# Patient Record
Sex: Female | Born: 1940 | Race: Black or African American | Hispanic: No | Smoking: Never smoker
Health system: Southern US, Community
[De-identification: ages and names within clinical notes are randomized; demographics above are authoritative.]

## PROBLEM LIST (undated history)

## (undated) DIAGNOSIS — Z8042 Family history of malignant neoplasm of prostate: Secondary | ICD-10-CM

## (undated) DIAGNOSIS — Z803 Family history of malignant neoplasm of breast: Secondary | ICD-10-CM

## (undated) DIAGNOSIS — Z9221 Personal history of antineoplastic chemotherapy: Secondary | ICD-10-CM

## (undated) DIAGNOSIS — K219 Gastro-esophageal reflux disease without esophagitis: Secondary | ICD-10-CM

## (undated) DIAGNOSIS — I10 Essential (primary) hypertension: Secondary | ICD-10-CM

## (undated) DIAGNOSIS — Z923 Personal history of irradiation: Secondary | ICD-10-CM

## (undated) DIAGNOSIS — Z800 Family history of malignant neoplasm of digestive organs: Secondary | ICD-10-CM

## (undated) HISTORY — DX: Family history of malignant neoplasm of breast: Z80.3

## (undated) HISTORY — PX: MASTECTOMY: SHX3

## (undated) HISTORY — DX: Family history of malignant neoplasm of digestive organs: Z80.0

## (undated) HISTORY — DX: Essential (primary) hypertension: I10

## (undated) HISTORY — DX: Family history of malignant neoplasm of prostate: Z80.42

---

## 1998-06-06 ENCOUNTER — Ambulatory Visit (HOSPITAL_COMMUNITY): Admission: RE | Admit: 1998-06-06 | Discharge: 1998-06-06 | Payer: Self-pay | Admitting: Pulmonary Disease

## 1998-06-06 ENCOUNTER — Encounter: Payer: Self-pay | Admitting: Pulmonary Disease

## 1998-06-12 ENCOUNTER — Ambulatory Visit (HOSPITAL_COMMUNITY): Admission: RE | Admit: 1998-06-12 | Discharge: 1998-06-12 | Payer: Self-pay | Admitting: Pulmonary Disease

## 1998-06-12 ENCOUNTER — Encounter: Payer: Self-pay | Admitting: Pulmonary Disease

## 1998-10-23 ENCOUNTER — Encounter: Admission: RE | Admit: 1998-10-23 | Discharge: 1999-01-21 | Payer: Self-pay | Admitting: Orthopedic Surgery

## 1998-10-23 ENCOUNTER — Ambulatory Visit (HOSPITAL_COMMUNITY): Admission: RE | Admit: 1998-10-23 | Discharge: 1998-10-23 | Payer: Self-pay | Admitting: Orthopedic Surgery

## 2000-04-13 ENCOUNTER — Ambulatory Visit (HOSPITAL_COMMUNITY): Admission: RE | Admit: 2000-04-13 | Discharge: 2000-04-13 | Payer: Self-pay | Admitting: Internal Medicine

## 2000-04-13 ENCOUNTER — Encounter: Payer: Self-pay | Admitting: Internal Medicine

## 2005-06-30 HISTORY — PX: OTHER SURGICAL HISTORY: SHX169

## 2009-09-19 ENCOUNTER — Encounter: Admission: RE | Admit: 2009-09-19 | Discharge: 2009-11-15 | Payer: Self-pay

## 2010-10-01 ENCOUNTER — Ambulatory Visit: Payer: Self-pay | Admitting: Physical Therapy

## 2010-10-01 DIAGNOSIS — Z139 Encounter for screening, unspecified: Secondary | ICD-10-CM | POA: Insufficient documentation

## 2012-06-30 HISTORY — PX: MASTECTOMY: SHX3

## 2017-01-22 ENCOUNTER — Other Ambulatory Visit: Payer: Self-pay | Admitting: Family Medicine

## 2017-01-22 DIAGNOSIS — R5381 Other malaise: Secondary | ICD-10-CM

## 2017-01-23 ENCOUNTER — Other Ambulatory Visit: Payer: Self-pay | Admitting: Family Medicine

## 2017-01-23 DIAGNOSIS — E2839 Other primary ovarian failure: Secondary | ICD-10-CM

## 2017-04-15 ENCOUNTER — Other Ambulatory Visit: Payer: Self-pay | Admitting: Family Medicine

## 2017-04-15 DIAGNOSIS — Z853 Personal history of malignant neoplasm of breast: Secondary | ICD-10-CM

## 2017-04-15 DIAGNOSIS — Z1231 Encounter for screening mammogram for malignant neoplasm of breast: Secondary | ICD-10-CM

## 2017-05-06 ENCOUNTER — Ambulatory Visit
Admission: RE | Admit: 2017-05-06 | Discharge: 2017-05-06 | Disposition: A | Discharge status: Home / Self Care | Payer: Medicare Other | Source: Ambulatory Visit | Attending: Family Medicine | Admitting: Family Medicine

## 2017-05-06 ENCOUNTER — Inpatient Hospital Stay
Admission: RE | Admit: 2017-05-06 | Discharge: 2017-05-06 | Disposition: A | Discharge status: Home / Self Care | Payer: Self-pay | Source: Ambulatory Visit | Attending: Family Medicine | Admitting: Family Medicine

## 2017-05-06 ENCOUNTER — Other Ambulatory Visit: Payer: Self-pay | Admitting: Family Medicine

## 2017-05-06 DIAGNOSIS — Z853 Personal history of malignant neoplasm of breast: Secondary | ICD-10-CM

## 2017-05-06 DIAGNOSIS — Z1231 Encounter for screening mammogram for malignant neoplasm of breast: Secondary | ICD-10-CM

## 2017-05-06 DIAGNOSIS — E2839 Other primary ovarian failure: Secondary | ICD-10-CM

## 2017-12-17 ENCOUNTER — Ambulatory Visit
Admission: RE | Admit: 2017-12-17 | Discharge: 2017-12-17 | Disposition: A | Discharge status: Home / Self Care | Payer: Medicare Other | Source: Ambulatory Visit | Attending: Physician Assistant | Admitting: Physician Assistant

## 2017-12-17 ENCOUNTER — Other Ambulatory Visit: Payer: Self-pay | Admitting: Physician Assistant

## 2017-12-17 DIAGNOSIS — M25519 Pain in unspecified shoulder: Secondary | ICD-10-CM

## 2018-04-19 ENCOUNTER — Other Ambulatory Visit: Payer: Self-pay | Admitting: Family Medicine

## 2018-04-19 DIAGNOSIS — Z1231 Encounter for screening mammogram for malignant neoplasm of breast: Secondary | ICD-10-CM

## 2018-05-25 ENCOUNTER — Ambulatory Visit
Admission: RE | Admit: 2018-05-25 | Discharge: 2018-05-25 | Disposition: A | Discharge status: Home / Self Care | Payer: Medicare Other | Source: Ambulatory Visit | Attending: Family Medicine | Admitting: Family Medicine

## 2018-05-25 DIAGNOSIS — Z1231 Encounter for screening mammogram for malignant neoplasm of breast: Secondary | ICD-10-CM

## 2018-05-25 HISTORY — DX: Personal history of irradiation: Z92.3

## 2018-05-25 HISTORY — DX: Personal history of antineoplastic chemotherapy: Z92.21

## 2018-05-31 ENCOUNTER — Other Ambulatory Visit: Payer: Self-pay | Admitting: Family Medicine

## 2018-05-31 DIAGNOSIS — R928 Other abnormal and inconclusive findings on diagnostic imaging of breast: Secondary | ICD-10-CM

## 2018-06-02 ENCOUNTER — Other Ambulatory Visit: Payer: Medicare Other

## 2018-06-04 ENCOUNTER — Ambulatory Visit
Admission: RE | Admit: 2018-06-04 | Discharge: 2018-06-04 | Disposition: A | Discharge status: Home / Self Care | Payer: Medicare Other | Source: Ambulatory Visit | Attending: Family Medicine | Admitting: Family Medicine

## 2018-06-04 DIAGNOSIS — R928 Other abnormal and inconclusive findings on diagnostic imaging of breast: Secondary | ICD-10-CM

## 2019-05-05 ENCOUNTER — Other Ambulatory Visit: Payer: Self-pay | Admitting: Family Medicine

## 2019-05-05 DIAGNOSIS — Z1231 Encounter for screening mammogram for malignant neoplasm of breast: Secondary | ICD-10-CM

## 2019-06-27 ENCOUNTER — Ambulatory Visit
Admission: RE | Admit: 2019-06-27 | Discharge: 2019-06-27 | Disposition: A | Discharge status: Home / Self Care | Payer: Medicare Other | Source: Ambulatory Visit | Attending: Family Medicine | Admitting: Family Medicine

## 2019-06-27 ENCOUNTER — Other Ambulatory Visit: Payer: Self-pay

## 2019-06-27 DIAGNOSIS — Z1231 Encounter for screening mammogram for malignant neoplasm of breast: Secondary | ICD-10-CM

## 2019-06-28 ENCOUNTER — Other Ambulatory Visit: Payer: Self-pay | Admitting: Family Medicine

## 2019-06-28 DIAGNOSIS — R928 Other abnormal and inconclusive findings on diagnostic imaging of breast: Secondary | ICD-10-CM

## 2019-07-04 ENCOUNTER — Other Ambulatory Visit: Payer: Self-pay | Admitting: Family Medicine

## 2019-07-04 ENCOUNTER — Other Ambulatory Visit: Payer: Self-pay

## 2019-07-04 ENCOUNTER — Ambulatory Visit
Admission: RE | Admit: 2019-07-04 | Discharge: 2019-07-04 | Disposition: A | Discharge status: Home / Self Care | Payer: Medicare Other | Source: Ambulatory Visit | Attending: Family Medicine | Admitting: Family Medicine

## 2019-07-04 DIAGNOSIS — R928 Other abnormal and inconclusive findings on diagnostic imaging of breast: Secondary | ICD-10-CM

## 2019-07-04 DIAGNOSIS — R921 Mammographic calcification found on diagnostic imaging of breast: Secondary | ICD-10-CM

## 2019-07-13 ENCOUNTER — Ambulatory Visit
Admission: RE | Admit: 2019-07-13 | Discharge: 2019-07-13 | Disposition: A | Discharge status: Home / Self Care | Payer: Medicare Other | Source: Ambulatory Visit | Attending: Family Medicine | Admitting: Family Medicine

## 2019-07-13 ENCOUNTER — Other Ambulatory Visit: Payer: Self-pay

## 2019-07-13 ENCOUNTER — Other Ambulatory Visit: Payer: Self-pay | Admitting: Family Medicine

## 2019-07-13 DIAGNOSIS — R921 Mammographic calcification found on diagnostic imaging of breast: Secondary | ICD-10-CM

## 2019-07-14 ENCOUNTER — Encounter: Payer: Self-pay | Admitting: *Deleted

## 2019-07-15 ENCOUNTER — Telehealth: Payer: Self-pay | Admitting: Hematology

## 2019-07-15 NOTE — Telephone Encounter (Signed)
Spoke to patient to confirm afternoon [facility] appointment for 1/20, packet mailed and emailed to patient

## 2019-07-18 ENCOUNTER — Other Ambulatory Visit: Payer: Self-pay | Admitting: *Deleted

## 2019-07-18 ENCOUNTER — Encounter: Payer: Self-pay | Admitting: *Deleted

## 2019-07-18 DIAGNOSIS — D0512 Intraductal carcinoma in situ of left breast: Secondary | ICD-10-CM

## 2019-07-18 NOTE — Progress Notes (Signed)
[place]   [phone] Fax:([phone]) [place] Note   Patient Care Team: Katherina Mires, MD as PCP - General (Family Medicine) Rockwell Germany, RN as Oncology Nurse Navigator Mauro Kaufmann, RN as Oncology Nurse Navigator  Date of Service:  07/20/2019   CHIEF COMPLAINTS/PURPOSE OF CONSULTATION:  Newly Diagnosed Ductal carcinoma in situ (DCIS) of left breast   Oncology History Overview Note  Cancer Staging No matching staging information was found for the patient.    Ductal carcinoma in situ (DCIS) of left breast  07/04/2019 Mammogram    Diagnostic Mammogram 07/04/19 IMPRESSION: Indeterminate calcifications in a linear pattern and at least 1 branch point in the upper outer left breast spanning 5 cm.     07/13/2019 Initial Biopsy    Diagnosis 07/13/19 1. Breast, left, needle core biopsy, anterior, UOQ coil clip - DUCTAL CARCINOMA IN SITU WITH CALCIFICATIONS, SEE COMMENT. - USUAL DUCTAL HYPERPLASIA. 2. Breast, left, needle core biopsy, posterior, UOQ X clip - ATYPICAL DUCTAL HYPERPLASIA WITH CALCIFICATIONS.   07/13/2019 Receptors her2    1. PROGNOSTIC INDICATORS Results: IMMUNOHISTOCHEMICAL AND MORPHOMETRIC ANALYSIS PERFORMED MANUALLY Estrogen Receptor: 95%, POSITIVE, STRONG STAINING INTENSITY Progesterone Receptor: 90%, POSITIVE, STRONG STAINING INTENSITY   07/18/2019 Initial Diagnosis   Ductal carcinoma in situ (DCIS) of left breast      HISTORY OF PRESENTING ILLNESS:  Patricia Matthews 79 y.o. female is a here because of newly diagnosed left breast DCIS. The patient presents to the Breast clinic today accompanied by her daughters.   She was diagnosed with right breast  cancer in 2003 discussed by mammogram. She underwent mastectomy, no reconstruction. She received adjuvant chemotherapy over 3 months with adriamycin and RT. She did not take antiestrogen therapy. She last saw her oncologist 2 years ago at [facility].  Her current DCIS  was found by mammogram. She denies nay change in her breast, nipple or body.   Today she has diffuse arthritis all over. She does not use cane/walker. She is on Tramadol 3-4 times a day managed by pain clinic in [place]. She required right knee replacement by orthopedist at [facility]. She denies constipation from Tramadol. She does not try to walk far. She notes she is otherwise baseline with no major system issues of chest, breathing, abdomen. She is active at home, but not with exercise.   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 She has 2 daughters  She has not drink alcohol in over 30 years. She is a non smoker. She lives alone and is still independent.  She has a PMHx of HTN. She has had mastectomy in 2003, right knee replacement surgery in 2007. I reviewed her medication list with her. Her mother had breast cancer in her 45s, her sister also had breast cancer. 2 of her brothers had prostate cancer and 5 paternal cousins with pancreatic cancer.     GYN HISTORY  Menarchal: 52 LMP: 79 years old  Contraceptive: No HRT: No G3P2A1 - has 2 daughters, first at 30, no breast feeding     REVIEW OF SYSTEMS:    Constitutional: Denies fevers, chills or abnormal night sweats Eyes: Denies blurriness of vision, double vision or watery eyes Ears, nose, mouth, throat, and face: Denies mucositis or sore throat Respiratory: Denies cough, dyspnea or wheezes Cardiovascular: Denies palpitation, chest discomfort or lower extremity swelling Gastrointestinal:  Denies nausea, heartburn or change in bowel habits Skin: Denies abnormal skin rashes MSK: (+) Diffuse arthritis  Lymphatics: Denies new  lymphadenopathy or easy bruising Neurological:Denies numbness, tingling or new weaknesses Behavioral/Psych: Mood is stable, no new changes  All other systems were reviewed with the patient and are negative.   MEDICAL HISTORY:  Past Medical History:  Diagnosis Date  . Hypertension   . Personal history of  chemotherapy   . Personal history of radiation therapy     SURGICAL HISTORY: Past Surgical History:  Procedure Laterality Date  . MASTECTOMY Right 2014  . right knee replacement  2007    SOCIAL HISTORY: Social History   Socioeconomic History  . Marital status: Single    Spouse name: Not on file  . Number of children: 2  . Years of education: Not on file  . Highest education level: Not on file  Occupational History  . Occupation: retired   Tobacco Use  . Smoking status: Never Smoker  . Smokeless tobacco: Never Used  Substance and Sexual Activity  . Alcohol use: Never  . Drug use: Not on file  . Sexual activity: Not on file  Other Topics Concern  . Not on file  Social History Narrative  . Not on file   Social Determinants of Health   Financial Resource Strain:   . Difficulty of Paying Living Expenses: Not on file  Food Insecurity:   . Worried About Charity fundraiser in the Last Year: Not on file  . Ran Out of Food in the Last Year: Not on file  Transportation Needs:   . Lack of Transportation (Medical): Not on file  . Lack of Transportation (Non-Medical): Not on file  Physical Activity:   . Days of Exercise per Week: Not on file  . Minutes of Exercise per Session: Not on file  Stress:   . Feeling of Stress : Not on file  Social Connections:   . Frequency of Communication with Friends and Family: Not on file  . Frequency of Social Gatherings with Friends and Family: Not on file  . Attends Religious Services: Not on file  . Active Member of Clubs or Organizations: Not on file  . Attends Archivist Meetings: Not on file  . Marital Status: Not on file  Intimate Partner Violence:   . Fear of Current or Ex-Partner: Not on file  . Emotionally Abused: Not on file  . Physically Abused: Not on file  . Sexually Abused: Not on file    FAMILY HISTORY: Family History  Problem Relation Age of Onset  . Breast cancer Mother        28s  . Breast cancer  Sister        42s  . Cancer Brother        prostate cancer   . Cancer Brother        prostate cancer  . Cancer Cousin        pancreatic cancer  . Cancer Cousin        pancreatic cancer in 5 paternal cousin     ALLERGIES:  has no allergies on file.  MEDICATIONS:  Current Outpatient Medications  Medication Sig Dispense Refill  . calcium-vitamin D (OSCAL WITH D) 500-200 MG-UNIT TABS tablet Take 2 tablets by mouth 2 (two) times daily.    . cyclobenzaprine (FLEXERIL) 10 MG tablet Take 1 tablet by mouth daily as needed.    . DULoxetine (CYMBALTA) 30 MG capsule Take 1 capsule by mouth daily.    . ergocalciferol (VITAMIN D2) 1.25 MG (50000 UT) capsule Take 1 capsule by mouth daily.    Marland Kitchen  gabapentin (NEURONTIN) 300 MG capsule Take 1 capsule by mouth at bedtime.    Marland Kitchen lisinopril-hydrochlorothiazide (ZESTORETIC) 10-12.5 MG tablet Take 1 tablet by mouth daily.    . meclizine (ANTIVERT) 25 MG tablet Take 1 tablet by mouth 3 (three) times daily.    Marland Kitchen omeprazole (PRILOSEC) 20 MG capsule Take 2 capsules by mouth daily.    . pravastatin (PRAVACHOL) 40 MG tablet Take 1 tablet by mouth daily.    . SUMAtriptan (IMITREX) 25 MG tablet Take 1 tablet by mouth every 2 (two) hours as needed.    . traMADol (ULTRAM) 50 MG tablet Take 1 tablet by mouth 2 (two) times daily as needed.     No current facility-administered medications for this visit.    PHYSICAL EXAMINATION: ECOG PERFORMANCE STATUS: 1 - Symptomatic but completely ambulatory  Vitals:   07/20/19 1240  BP: (!) 156/88  Pulse: 80  Resp: 18  Temp: 98.5 F (36.9 C)  SpO2: 98%   Filed Weights   07/20/19 1240  Weight: 215 lb 11.2 oz (97.8 kg)    GENERAL:alert, no distress and comfortable SKIN: skin color, texture, turgor are normal, no rashes or significant lesions EYES: normal, Conjunctiva are pink and non-injected, sclera clear  NECK: supple, thyroid normal size, non-tender, without nodularity LYMPH:  no palpable lymphadenopathy in the  cervical, axillary  LUNGS: clear to auscultation and percussion with normal breathing effort HEART: regular rate & rhythm and no murmurs and no lower extremity edema ABDOMEN:abdomen soft, non-tender and normal bowel sounds Musculoskeletal:no cyanosis of digits and no clubbing  NEURO: alert & oriented x 3 with fluent speech, no focal motor/sensory deficits BREAST: S/p right mastectomy: Surgical incision healed well (+)hyperpigmentation of b/l skin folds of breast. No palpable mass, nodules or adenopathy bilaterally. Breast exam benign.  LABORATORY DATA:  I have reviewed the data as listed CBC Latest Ref Rng & Units 07/20/2019  WBC 4.0 - 10.5 K/uL 5.9  Hemoglobin 12.0 - 15.0 g/dL 12.4  Hematocrit 36.0 - 46.0 % 37.8  Platelets 150 - 400 K/uL 250    CMP Latest Ref Rng & Units 07/20/2019  Glucose 70 - 99 mg/dL 96  BUN 8 - 23 mg/dL 8  Creatinine 0.44 - 1.00 mg/dL 0.75  Sodium 135 - 145 mmol/L 138  Potassium 3.5 - 5.1 mmol/L 4.2  Chloride 98 - 111 mmol/L 101  CO2 22 - 32 mmol/L 29  Calcium 8.9 - 10.3 mg/dL 9.5  Total Protein 6.5 - 8.1 g/dL 7.8  Total Bilirubin 0.3 - 1.2 mg/dL 0.4  Alkaline Phos 38 - 126 U/L 64  AST 15 - 41 U/L 15  ALT 0 - 44 U/L 8     RADIOGRAPHIC STUDIES: I have personally reviewed the radiological images as listed and agreed with the findings in the report. MM Digital Diagnostic Unilat L  Result Date: 07/04/2019 CLINICAL DATA:  The patient was called back for left breast calcifications EXAM: DIGITAL DIAGNOSTIC LEFT MAMMOGRAM COMPARISON:  Previous exam(s). ACR Breast Density Category b: There are scattered areas of fibroglandular density. FINDINGS: There are calcifications in a linear pattern spanning 5 cm in the upper outer left breast with at least 1 branch point. IMPRESSION: Indeterminate calcifications in a linear pattern and at least 1 branch point in the upper outer left breast spanning 5 cm. RECOMMENDATION: Recommend stereotactic biopsy of the left breast  calcifications. Recommend biopsying the anterior extent of calcifications and towards the posterior extent. I have discussed the findings and recommendations with the patient. If applicable,  a reminder letter will be sent to the patient regarding the next appointment. BI-RADS CATEGORY  4: Suspicious. Electronically Signed   By: Dorise Bullion III M.D   On: 07/04/2019 16:09   MM 3D SCREEN BREAST UNI LEFT  Result Date: 06/27/2019 CLINICAL DATA:  Screening. EXAM: DIGITAL SCREENING UNILATERAL LEFT MAMMOGRAM WITH CAD AND TOMO COMPARISON:  Previous exam(s). ACR Breast Density Category b: There are scattered areas of fibroglandular density. FINDINGS: In the left breast, calcifications warrant further evaluation. In the right breast, no findings suspicious for malignancy. Images were processed with CAD. IMPRESSION: Further evaluation is suggested for calcifications in the left breast. RECOMMENDATION: Diagnostic mammogram of the left breast. (Code:[id number]) The patient will be contacted regarding the findings, and additional imaging will be scheduled. BI-RADS CATEGORY  0: Incomplete. Need additional imaging evaluation and/or prior mammograms for comparison. Electronically Signed   By: Claudie Revering M.D.   On: 06/27/2019 16:45   MM CLIP PLACEMENT LEFT  Result Date: 07/13/2019 CLINICAL DATA:  Status post stereotactic guided core biopsy of 2 sites of calcifications in the UPPER-OUTER QUADRANT LEFT breast. EXAM: DIAGNOSTIC LEFT MAMMOGRAM POST STEREOTACTIC BIOPSY x2 COMPARISON:  Previous exam(s). FINDINGS: Mammographic images were obtained following stereotactic guided biopsy of anterior aspect of calcifications in the UPPER-OUTER QUADRANT of the LEFT breast and placement of a coil shaped clip. The biopsy marking clip is in expected position at the site of biopsy. Following biopsy the posterior extent of calcifications, an X shaped clip was placed in is also identified at the expected location. Clips are 4.4 centimeters  apart. IMPRESSION: Tissue marker clips are in the expected locations after biopsy. Final Assessment: Post Procedure Mammograms for Marker Placement Electronically Signed   By: Nolon Nations M.D.   On: 07/13/2019 11:31   MM LT BREAST BX W LOC DEV 1ST LESION IMAGE BX SPEC STEREO GUIDE  Addendum Date: 07/14/2019   ADDENDUM REPORT: 07/14/2019 15:33 ADDENDUM: Pathology revealed LOW GRADE DUCTAL CARCINOMA IN SITU WITH CALCIFICATIONS, USUAL DUCTAL HYPERPLASIA of the LEFT breast, anterior, upper outer quadrant, coil clip. This was found to be concordant by Dr. Nolon Nations. Pathology revealed ATYPICAL DUCTAL HYPERPLASIA WITH CALCIFICATIONS of the LEFT breast, posterior, upper outer quadrant, X clip. This was found to be concordant by Dr. Nolon Nations, with excision recommended. Pathology results were discussed with the patient by telephone. The patient reported doing well after the biopsies with tenderness at the sites. Post biopsy instructions and care were reviewed and questions were answered. The patient was encouraged to call [facility] for any additional concerns. The patient was referred to [facility] on July 20, 2019. Pathology results reported by Stacie Acres RN on 07/14/2019. Electronically Signed   By: Nolon Nations M.D.   On: 07/14/2019 15:33   Result Date: 07/14/2019 CLINICAL DATA:  Patient presents for stereotactic guided core biopsy of 2 sites in the LEFT breast. EXAM: LEFT BREAST STEREOTACTIC CORE NEEDLE BIOPSY x2 COMPARISON:  Previous exams. FINDINGS: The patient and I discussed the procedure of stereotactic-guided biopsy including benefits and alternatives. We discussed the high likelihood of a successful procedure. We discussed the risks of the procedure including infection, bleeding, tissue injury, clip migration, and inadequate sampling. Informed written consent was given. The  usual time out protocol was performed immediately prior to the procedure. Site 1: Anterior UPPER OUTER QUADRANT LEFT breast. Lesion quadrant: UPPER-OUTER QUADRANT LEFT breast Using sterile technique and 1% lidocaine and  2% lidocaine with epinephrine as local anesthetic, under stereotactic guidance, a 9 gauge vacuum assisted device was used to perform core needle biopsy of calcifications in the anterior UPPER OUTER QUADRANT of the breast using a 2 craniocaudal approach. Specimen radiograph was performed showing calcifications in numerous samples. Specimens with calcifications are identified for pathology. At the conclusion of the procedure, coil shaped tissue marker clip was deployed into the biopsy cavity. Site 2: Posterior UPPER OUTER QUADRANT LEFT breast. Lesion quadrant: UPPER-OUTER QUADRANT breast Using sterile technique and 1% lidocaine and 2% lidocaine with epinephrine as local anesthetic, under stereotactic guidance, a 9 gauge vacuum assisted device was used to perform core needle biopsy of calcifications in the posterior UPPER OUTER QUADRANT LEFT breast using a craniocaudal approach. Specimen radiograph was performed showing calcifications in numerous samples. Specimens with calcifications are identified for pathology. At the conclusion of the procedure, X shaped tissue marker clip was deployed into the biopsy cavity. Follow-up 2-view mammogram was performed and dictated separately. IMPRESSION: Stereotactic-guided biopsy of 2 sites of calcifications in the LEFT breast. No apparent complications. Electronically Signed: By: Nolon Nations M.D. 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 This was found to be concordant by Dr.  Nolon Nations. Pathology revealed ATYPICAL DUCTAL HYPERPLASIA WITH CALCIFICATIONS of the LEFT breast, posterior, upper outer quadrant, X clip. This was found to be concordant by Dr. Nolon Nations, with excision recommended. Pathology results were discussed with the patient by telephone. The patient reported doing well after the biopsies with tenderness at the sites. Post biopsy instructions and care were reviewed and questions were answered. The patient was encouraged to call [facility] for any additional concerns. The patient was referred to [facility] on July 20, 2019. Pathology results reported by Stacie Acres RN on 07/14/2019. Electronically Signed   By: Nolon Nations M.D.   On: 07/14/2019 15:33   Result Date: 07/14/2019 CLINICAL DATA:  Patient presents for stereotactic guided core biopsy of 2 sites in the LEFT breast. EXAM: LEFT BREAST STEREOTACTIC CORE NEEDLE BIOPSY x2 COMPARISON:  Previous exams. FINDINGS: The patient and I discussed the procedure of stereotactic-guided biopsy including benefits and alternatives. We discussed the high likelihood of a successful procedure. We discussed the risks of the procedure including infection, bleeding, tissue injury, clip migration, and inadequate sampling. Informed written consent was given. The usual time out protocol was performed immediately prior to the procedure. Site 1: Anterior UPPER OUTER QUADRANT LEFT breast. Lesion quadrant: UPPER-OUTER QUADRANT LEFT breast Using sterile technique and 1% lidocaine and 2% lidocaine with epinephrine as local anesthetic, under stereotactic guidance, a 9 gauge vacuum assisted device was used to perform core needle biopsy of calcifications in the anterior UPPER OUTER QUADRANT of the breast using a 2 craniocaudal approach. Specimen radiograph was performed showing calcifications in numerous samples. Specimens with  calcifications are identified for pathology. At the conclusion of the procedure, coil shaped tissue marker clip was deployed into the biopsy cavity. Site 2: Posterior UPPER OUTER QUADRANT LEFT breast. Lesion quadrant: UPPER-OUTER QUADRANT breast Using sterile technique and 1% lidocaine and 2% lidocaine with epinephrine as local anesthetic, under stereotactic guidance, a 9 gauge vacuum  assisted device was used to perform core needle biopsy of calcifications in the posterior UPPER OUTER QUADRANT LEFT breast using a craniocaudal approach. Specimen radiograph was performed showing calcifications in numerous samples. Specimens with calcifications are identified for pathology. At the conclusion of the procedure, X shaped tissue marker clip was deployed into the biopsy cavity. Follow-up 2-view mammogram was performed and dictated separately. IMPRESSION: Stereotactic-guided biopsy of 2 sites of calcifications in the LEFT breast. No apparent complications. Electronically Signed: By: Nolon Nations M.D. On: 07/13/2019 11:24    ASSESSMENT & PLAN:  Patricia Matthews is a 79 y.o. African American female with a history of HTN and arthritis.    1 Ductal carcinoma in situ (DCIS) of left breast, Stage 0, ER/PR+, Low Grade  -I discussed her breast imaging and needle biopsy results with patient and her family members in great detail. She was find to have DCIS of left breast, with ADH. No indication of invasive disease.  -She is a candidate for breast conservation surgery.  She previously had right mastectomy without reconstruction, , so she rather to have left mastectomy to avoid adjuvant therapy.  She has been seen by breast surgeon Dr. Marlou Starks today and will likely do mastectomy.  -With h/o of right breast cancer, DCIS and ADH she has higher risk of future breast cancer, about 15-20% in next 10 years post lumpectomy. If she undergo lumpectomy, she would benefit from adjuvant radiation and oral estrogen therapy.  Due to her severe  arthralgia, she is not interested in antiestrogen therapy. -Her DCIS will be cured by complete surgical resection.  No any adjuvant therapy is needed if she undergo mastectomy. -We also discussed that biopsy may have sampling limitation, we will review her surgical path, to see if she has any invasive carcinoma components. -We discussed breast cancer surveillance after she completes treatment, Including annual mammogram, breast exam every 6-12 months. -Labs reviewed today, CBC and CMP WNL. Physical exam today unremarkable. I encouraged her to use corn starch in breast skin folds to prevent infection.  -F/u open as she may not need close follow up.    2. H/o of Right breast cancer, diagnosed in 2003, treated at [facility] -S/p right mastectomy, no reconstruction. She was treated with adjuvant chemo including Adriamycin and adjuvant RT. No antiestrogen therapy. She was last seen by oncologist 2 year ago.  -She had genetics at [facility] with prior breast cancer. Given h/o of breast cancer for her and her family along with prostate and pancreatic cancer she is eligible for updated genetic testing. She is interested.    3. HTN, Migraines, Diffuse Arthritis  -She is on Lisinopril-HCTZ  -For her occasional Migraines she takes Mitrex  -She is s/p right knee replacement in 2007. She still has diffuse significant arthritis. She is on Tramadol 3-4 times a day for pain, managed by her pain specialist. She also takes Flexeril, Cymbalta, Vit D.     PLAN:  -Genetic Testing  -Proceed with Surgery  -F/u open, no need f/u with me if she has mastectomy   No orders of the defined types were placed in this encounter.   All questions were answered. The patient knows to call the clinic with any problems, questions or concerns. The total time spent in the appointment was 45 minutes.     Truitt Merle, MD 07/20/2019 4:08 PM  I, Joslyn Devon, am acting as scribe for Truitt Merle, MD.   I have reviewed the above documentation  for accuracy and completeness, and I  agree with the above.

## 2019-07-19 NOTE — Progress Notes (Signed)
Radiation Oncology         [phone] ________________________________  Initial outpatient Consultation in person  Name: Patricia Matthews MRN: [id number]  Date: 07/20/2019  DOB: [birth year]  [id number], Jannifer Rodney, MD  Jovita Kussmaul, MD   REFERRING PHYSICIAN: Jovita Kussmaul, MD  DIAGNOSIS:    ICD-10-CM   1. Ductal carcinoma in situ (DCIS) of left breast  D05.12   Cancer Staging Ductal carcinoma in situ (DCIS) of left breast Staging form: Breast, AJCC 8th Edition - Clinical stage from 07/20/2019: Stage 0 (cTis (DCIS), cN0, cM0, ER+, PR+) - Unsigned   Stage 0 Left Breast UOQ DCIS, ER+ / PR+, Grade 1  CHIEF COMPLAINT: Here to discuss management of left breast DCIS  HISTORY OF PRESENT ILLNESS::Patricia Matthews is a 79 y.o. female with a history of prior right breast cancer. She is s/p right mastectomy in 2003 for stage T3, N1, M0 overexpressing breast cancer, ER/PR-, Her2+. She received 4 cycles of AC and taxol, as well as a course of postmastectomy radiation therapy.  Most recently, she presented with breast abnormality on the following imaging: screening mammogram on the date of 06/27/2019.  Symptoms, if any, at that time, were: none.   Diagnostic mammogram on 07/04/2019 revealed calcifications spanning 5 cm in the upper-outer left breast with at least 1 branch point.   Biopsy on date of 07/13/2019 showed DCIS with calcifications.  ER status: positive; PR status positive; Grade 1.  She has a significant family history and a genetics consultation is pending.  She plans on pursuing mastectomy with Dr. Marlou Starks.  PREVIOUS RADIATION THERAPY: Yes  2003: Right Chest Wall (at [facility])  PAST MEDICAL HISTORY:  has a past medical history of Hypertension, Personal history of chemotherapy, and Personal history of radiation therapy.    PAST SURGICAL HISTORY: Past Surgical History:  Procedure Laterality Date  . MASTECTOMY Right 2014  . right knee replacement  2007    FAMILY HISTORY: family history includes  Breast cancer in her mother and sister; Cancer in her brother, brother, cousin, and cousin.  SOCIAL HISTORY:  reports that she has never smoked. She has never used smokeless tobacco. She reports that she does not drink alcohol.  ALLERGIES: Patient has no allergy information on record.  MEDICATIONS:  Current Outpatient Medications  Medication Sig Dispense Refill  . calcium-vitamin D (OSCAL WITH D) 500-200 MG-UNIT TABS tablet Take 2 tablets by mouth 2 (two) times daily.    . cyclobenzaprine (FLEXERIL) 10 MG tablet Take 1 tablet by mouth daily as needed.    . DULoxetine (CYMBALTA) 30 MG capsule Take 1 capsule by mouth daily.    . ergocalciferol (VITAMIN D2) 1.25 MG (50000 UT) capsule Take 1 capsule by mouth daily.    Marland Kitchen gabapentin (NEURONTIN) 300 MG capsule Take 1 capsule by mouth at bedtime.    Marland Kitchen lisinopril-hydrochlorothiazide (ZESTORETIC) 10-12.5 MG tablet Take 1 tablet by mouth daily.    . meclizine (ANTIVERT) 25 MG tablet Take 1 tablet by mouth 3 (three) times daily.    Marland Kitchen omeprazole (PRILOSEC) 20 MG capsule Take 2 capsules by mouth daily.    . pravastatin (PRAVACHOL) 40 MG tablet Take 1 tablet by mouth daily.    . SUMAtriptan (IMITREX) 25 MG tablet Take 1 tablet by mouth every 2 (two) hours as needed.    . traMADol (ULTRAM) 50 MG tablet Take 1 tablet by mouth 2 (two) times daily as needed.     No current facility-administered medications for this encounter.  REVIEW OF SYSTEMS: As above  PHYSICAL EXAM:  vitals were not taken for this visit.   General: Alert and oriented, in no acute distress    ECOG = 1  0 - Asymptomatic (Fully active, able to carry on all predisease activities without restriction)  1 - Symptomatic but completely ambulatory (Restricted in physically strenuous activity but ambulatory and able to carry out work of a light or sedentary nature. For example, light housework, office work)  2 - Symptomatic, <50% in bed during the day (Ambulatory and capable of all self  care but unable to carry out any work activities. Up and about more than 50% of waking hours)  3 - Symptomatic, >50% in bed, but not bedbound (Capable of only limited self-care, confined to bed or chair 50% or more of waking hours)  4 - Bedbound (Completely disabled. Cannot carry on any self-care. Totally confined to bed or chair)  5 - Death   Eustace Pen MM, Creech RH, Tormey DC, et al. [phone]). "Toxicity and response criteria of the [facility]". [employer] Oncol. 5 (6): 649-55   LABORATORY DATA:  Lab Results  Component Value Date   WBC 5.9 07/20/2019   HGB 12.4 07/20/2019   HCT 37.8 07/20/2019   MCV 86.7 07/20/2019   PLT 250 07/20/2019   CMP     Component Value Date/Time   NA 138 07/20/2019 1220   K 4.2 07/20/2019 1220   CL 101 07/20/2019 1220   CO2 29 07/20/2019 1220   GLUCOSE 96 07/20/2019 1220   BUN 8 07/20/2019 1220   CREATININE 0.75 07/20/2019 1220   CALCIUM 9.5 07/20/2019 1220   PROT 7.8 07/20/2019 1220   ALBUMIN 3.8 07/20/2019 1220   AST 15 07/20/2019 1220   ALT 8 07/20/2019 1220   ALKPHOS 64 07/20/2019 1220   BILITOT 0.4 07/20/2019 1220   GFRNONAA >60 07/20/2019 1220   GFRAA >60 07/20/2019 1220         RADIOGRAPHY: MM Digital Diagnostic Unilat L  Result Date: 07/04/2019 CLINICAL DATA:  The patient was called back for left breast calcifications EXAM: DIGITAL DIAGNOSTIC LEFT MAMMOGRAM COMPARISON:  Previous exam(s). ACR Breast Density Category b: There are scattered areas of fibroglandular density. FINDINGS: There are calcifications in a linear pattern spanning 5 cm in the upper outer left breast with at least 1 branch point. IMPRESSION: Indeterminate calcifications in a linear pattern and at least 1 branch point in the upper outer left breast spanning 5 cm. RECOMMENDATION: Recommend stereotactic biopsy of the left breast calcifications. Recommend biopsying the anterior extent of calcifications and towards the posterior extent. I have discussed the  findings and recommendations with the patient. If applicable, a reminder letter will be sent to the patient regarding the next appointment. BI-RADS CATEGORY  4: Suspicious. Electronically Signed   By: Dorise Bullion III M.D   On: 07/04/2019 16:09   MM 3D SCREEN BREAST UNI LEFT  Result Date: 06/27/2019 CLINICAL DATA:  Screening. EXAM: DIGITAL SCREENING UNILATERAL LEFT MAMMOGRAM WITH CAD AND TOMO COMPARISON:  Previous exam(s). ACR Breast Density Category b: There are scattered areas of fibroglandular density. FINDINGS: In the left breast, calcifications warrant further evaluation. In the right breast, no findings suspicious for malignancy. Images were processed with CAD. IMPRESSION: Further evaluation is suggested for calcifications in the left breast. RECOMMENDATION: Diagnostic mammogram of the left breast. (Code:[id number]) The patient will be contacted regarding the findings, and additional imaging will be scheduled. BI-RADS CATEGORY  0: Incomplete. Need additional imaging  evaluation and/or prior mammograms for comparison. Electronically Signed   By: Claudie Revering M.D.   On: 06/27/2019 16:45   MM CLIP PLACEMENT LEFT  Result Date: 07/13/2019 CLINICAL DATA:  Status post stereotactic guided core biopsy of 2 sites of calcifications in the UPPER-OUTER QUADRANT LEFT breast. EXAM: DIAGNOSTIC LEFT MAMMOGRAM POST STEREOTACTIC BIOPSY x2 COMPARISON:  Previous exam(s). FINDINGS: Mammographic images were obtained following stereotactic guided biopsy of anterior aspect of calcifications in the UPPER-OUTER QUADRANT of the LEFT breast and placement of a coil shaped clip. The biopsy marking clip is in expected position at the site of biopsy. Following biopsy the posterior extent of calcifications, an X shaped clip was placed in is also identified at the expected location. Clips are 4.4 centimeters apart. IMPRESSION: Tissue marker clips are in the expected locations after biopsy. Final Assessment: Post Procedure Mammograms  for Marker Placement Electronically Signed   By: Nolon Nations M.D.   On: 07/13/2019 11:31   MM LT BREAST BX W LOC DEV 1ST LESION IMAGE BX SPEC STEREO GUIDE  Addendum Date: 07/14/2019   ADDENDUM REPORT: 07/14/2019 15:33 ADDENDUM: Pathology revealed LOW GRADE DUCTAL CARCINOMA IN SITU WITH CALCIFICATIONS, USUAL DUCTAL HYPERPLASIA of the LEFT breast, anterior, upper outer quadrant, coil clip. This was found to be concordant by Dr. Nolon Nations. Pathology revealed ATYPICAL DUCTAL HYPERPLASIA WITH CALCIFICATIONS of the LEFT breast, posterior, upper outer quadrant, X clip. This was found to be concordant by Dr. Nolon Nations, with excision recommended. Pathology results were discussed with the patient by telephone. The patient reported doing well after the biopsies with tenderness at the sites. Post biopsy instructions and care were reviewed and questions were answered. The patient was encouraged to call [facility] for any additional concerns. The patient was referred to [facility] at [facility] on July 20, 2019. Pathology results reported by Stacie Acres RN on 07/14/2019. Electronically Signed   By: Nolon Nations M.D.   On: 07/14/2019 15:33   Result Date: 07/14/2019 CLINICAL DATA:  Patient presents for stereotactic guided core biopsy of 2 sites in the LEFT breast. EXAM: LEFT BREAST STEREOTACTIC CORE NEEDLE BIOPSY x2 COMPARISON:  Previous exams. FINDINGS: The patient and I discussed the procedure of stereotactic-guided biopsy including benefits and alternatives. We discussed the high likelihood of a successful procedure. We discussed the risks of the procedure including infection, bleeding, tissue injury, clip migration, and inadequate sampling. Informed written consent was given. The usual time out protocol was performed immediately prior to the procedure. Site 1: Anterior UPPER OUTER QUADRANT LEFT breast. Lesion  quadrant: UPPER-OUTER QUADRANT LEFT breast Using sterile technique and 1% lidocaine and 2% lidocaine with epinephrine as local anesthetic, under stereotactic guidance, a 9 gauge vacuum assisted device was used to perform core needle biopsy of calcifications in the anterior UPPER OUTER QUADRANT of the breast using a 2 craniocaudal approach. Specimen radiograph was performed showing calcifications in numerous samples. Specimens with calcifications are identified for pathology. At the conclusion of the procedure, coil shaped tissue marker clip was deployed into the biopsy cavity. Site 2: Posterior UPPER OUTER QUADRANT LEFT breast. Lesion quadrant: UPPER-OUTER QUADRANT breast Using sterile technique and 1% lidocaine and 2% lidocaine with epinephrine as local anesthetic, under stereotactic guidance, a 9 gauge vacuum assisted device was used to perform core needle biopsy of calcifications in the posterior UPPER OUTER QUADRANT LEFT breast using a craniocaudal approach. Specimen radiograph was performed showing calcifications in numerous samples. Specimens  with calcifications are identified for pathology. At the conclusion of the procedure, X shaped tissue marker clip was deployed into the biopsy cavity. Follow-up 2-view mammogram was performed and dictated separately. IMPRESSION: Stereotactic-guided biopsy of 2 sites of calcifications in the LEFT breast. No apparent complications. Electronically Signed: By: Nolon Nations M.D. 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 This was found to be concordant by Dr. Nolon Nations. 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 This was found to be concordant by Dr. Nolon Nations, with excision recommended. Pathology results were discussed with the patient by telephone. The patient reported doing well after the biopsies with tenderness at the sites. Post biopsy instructions and care were reviewed and questions were answered. The patient was encouraged to call [facility] for any additional concerns. The patient was referred to [facility] on July 20, 2019. Pathology results reported by Stacie Acres RN on 07/14/2019. Electronically Signed   By: Nolon Nations M.D.   On: 07/14/2019 15:33   Result Date: 07/14/2019 CLINICAL DATA:  Patient presents for stereotactic guided core biopsy of 2 sites in the LEFT breast. EXAM: LEFT BREAST STEREOTACTIC CORE NEEDLE BIOPSY x2 COMPARISON:  Previous exams. FINDINGS: The patient and I discussed the procedure of stereotactic-guided biopsy including benefits and alternatives. We discussed the high likelihood of a successful procedure. We discussed the risks of the procedure including infection, bleeding, tissue injury, clip migration, and inadequate sampling. Informed written consent was given. The usual time out protocol was performed immediately prior to the procedure. Site 1: Anterior UPPER OUTER QUADRANT LEFT breast. Lesion quadrant: UPPER-OUTER QUADRANT LEFT breast Using sterile technique and 1% lidocaine and 2% lidocaine with epinephrine as local anesthetic, under stereotactic guidance, a 9 gauge vacuum assisted device was used to perform core needle biopsy of calcifications in the anterior UPPER OUTER QUADRANT of the breast using a 2 craniocaudal approach. Specimen radiograph was performed showing calcifications in numerous samples. Specimens with calcifications are identified for pathology. At the conclusion of the procedure, coil shaped tissue marker clip was deployed into the biopsy  cavity. Site 2: Posterior UPPER OUTER QUADRANT LEFT breast. Lesion quadrant: UPPER-OUTER QUADRANT breast Using sterile technique and 1% lidocaine and 2% lidocaine with epinephrine as local anesthetic, under stereotactic guidance, a 9 gauge vacuum assisted device was used to perform core needle biopsy of calcifications in the posterior UPPER OUTER QUADRANT LEFT breast using a craniocaudal approach. Specimen radiograph was performed showing calcifications in numerous samples. Specimens with calcifications are identified for pathology. At the conclusion of the procedure, X shaped tissue marker clip was deployed into the biopsy cavity. Follow-up 2-view mammogram was performed and dictated separately. IMPRESSION: Stereotactic-guided biopsy of 2 sites of calcifications in the LEFT breast. No apparent complications. Electronically Signed: By: Nolon Nations M.D. On: 07/13/2019 11:24      IMPRESSION/PLAN: Left Breast DCIS   Patient was discussed at tumor board.  I reviewed her imaging personally.  Her options include breast conserving surgery followed by radiotherapy, versus mastectomy.  She is enthusiastic about pursuing mastectomy.  I told the patient and her family  that it is very unlikely that she will need postoperative radiotherapy after mastectomy.  I will see her back on an as needed basis.  I wished her the very best.  In total, on date of service, I spent 20 minutes on this encounter. __________________________________________   Eppie Gibson, MD   This document serves as a record of services personally performed by Eppie Gibson, MD. It was created on her behalf by Wilburn Mylar, a trained medical scribe. The creation of this record is based on the scribe's personal observations and the provider's statements to them. This document has been checked and approved by the attending provider.

## 2019-07-20 ENCOUNTER — Ambulatory Visit
Admission: RE | Admit: 2019-07-20 | Discharge: 2019-07-20 | Disposition: A | Discharge status: Home / Self Care | Payer: Medicare Other | Source: Ambulatory Visit | Attending: Radiation Oncology | Admitting: Radiation Oncology

## 2019-07-20 ENCOUNTER — Encounter: Payer: Self-pay | Admitting: Genetic Counselor

## 2019-07-20 ENCOUNTER — Inpatient Hospital Stay: Payer: Medicare Other

## 2019-07-20 ENCOUNTER — Inpatient Hospital Stay: Payer: Medicare Other | Attending: Hematology | Admitting: Hematology

## 2019-07-20 ENCOUNTER — Ambulatory Visit: Payer: Self-pay | Admitting: General Surgery

## 2019-07-20 ENCOUNTER — Encounter: Payer: Self-pay | Admitting: Radiation Oncology

## 2019-07-20 ENCOUNTER — Encounter: Payer: Self-pay | Admitting: Hematology

## 2019-07-20 ENCOUNTER — Ambulatory Visit (HOSPITAL_BASED_OUTPATIENT_CLINIC_OR_DEPARTMENT_OTHER): Payer: Medicare Other | Admitting: Genetic Counselor

## 2019-07-20 ENCOUNTER — Ambulatory Visit: Payer: Medicare Other | Admitting: Physical Therapy

## 2019-07-20 ENCOUNTER — Other Ambulatory Visit: Payer: Self-pay

## 2019-07-20 VITALS — BP 156/88 | HR 80 | Temp 98.5°F | Resp 18 | Wt 215.7 lb

## 2019-07-20 DIAGNOSIS — Z923 Personal history of irradiation: Secondary | ICD-10-CM | POA: Insufficient documentation

## 2019-07-20 DIAGNOSIS — Z9011 Acquired absence of right breast and nipple: Secondary | ICD-10-CM

## 2019-07-20 DIAGNOSIS — Z170 Estrogen receptor positive status [ER+]: Secondary | ICD-10-CM | POA: Diagnosis not present

## 2019-07-20 DIAGNOSIS — Z800 Family history of malignant neoplasm of digestive organs: Secondary | ICD-10-CM | POA: Insufficient documentation

## 2019-07-20 DIAGNOSIS — M1990 Unspecified osteoarthritis, unspecified site: Secondary | ICD-10-CM | POA: Insufficient documentation

## 2019-07-20 DIAGNOSIS — Z96651 Presence of right artificial knee joint: Secondary | ICD-10-CM | POA: Insufficient documentation

## 2019-07-20 DIAGNOSIS — D0512 Intraductal carcinoma in situ of left breast: Secondary | ICD-10-CM | POA: Insufficient documentation

## 2019-07-20 DIAGNOSIS — G43909 Migraine, unspecified, not intractable, without status migrainosus: Secondary | ICD-10-CM | POA: Insufficient documentation

## 2019-07-20 DIAGNOSIS — Z9221 Personal history of antineoplastic chemotherapy: Secondary | ICD-10-CM | POA: Diagnosis not present

## 2019-07-20 DIAGNOSIS — Z853 Personal history of malignant neoplasm of breast: Secondary | ICD-10-CM | POA: Insufficient documentation

## 2019-07-20 DIAGNOSIS — Z803 Family history of malignant neoplasm of breast: Secondary | ICD-10-CM

## 2019-07-20 DIAGNOSIS — Z8042 Family history of malignant neoplasm of prostate: Secondary | ICD-10-CM | POA: Insufficient documentation

## 2019-07-20 DIAGNOSIS — I10 Essential (primary) hypertension: Secondary | ICD-10-CM | POA: Diagnosis not present

## 2019-07-20 DIAGNOSIS — Z79899 Other long term (current) drug therapy: Secondary | ICD-10-CM

## 2019-07-20 LAB — CBC WITH DIFFERENTIAL (CANCER CENTER ONLY)
Abs Immature Granulocytes: 0.01 10*3/uL (ref 0.00–0.07)
Basophils Absolute: 0 10*3/uL (ref 0.0–0.1)
Basophils Relative: 0 %
Eosinophils Absolute: 0.1 10*3/uL (ref 0.0–0.5)
Eosinophils Relative: 2 %
HCT: 37.8 % (ref 36.0–46.0)
Hemoglobin: 12.4 g/dL (ref 12.0–15.0)
Immature Granulocytes: 0 %
Lymphocytes Relative: 37 %
Lymphs Abs: 2.2 10*3/uL (ref 0.7–4.0)
MCH: 28.4 pg (ref 26.0–34.0)
MCHC: 32.8 g/dL (ref 30.0–36.0)
MCV: 86.7 fL (ref 80.0–100.0)
Monocytes Absolute: 0.5 10*3/uL (ref 0.1–1.0)
Monocytes Relative: 9 %
Neutro Abs: 3.1 10*3/uL (ref 1.7–7.7)
Neutrophils Relative %: 52 %
Platelet Count: 250 10*3/uL (ref 150–400)
RBC: 4.36 MIL/uL (ref 3.87–5.11)
RDW: 12.4 % (ref 11.5–15.5)
WBC Count: 5.9 10*3/uL (ref 4.0–10.5)
nRBC: 0 % (ref 0.0–0.2)

## 2019-07-20 LAB — CMP (CANCER CENTER ONLY)
ALT: 8 U/L (ref 0–44)
AST: 15 U/L (ref 15–41)
Albumin: 3.8 g/dL (ref 3.5–5.0)
Alkaline Phosphatase: 64 U/L (ref 38–126)
Anion gap: 8 (ref 5–15)
BUN: 8 mg/dL (ref 8–23)
CO2: 29 mmol/L (ref 22–32)
Calcium: 9.5 mg/dL (ref 8.9–10.3)
Chloride: 101 mmol/L (ref 98–111)
Creatinine: 0.75 mg/dL (ref 0.44–1.00)
GFR, Est AFR Am: >60 mL/min (ref >60)
GFR, Estimated: >60 mL/min (ref >60)
Glucose, Bld: 96 mg/dL (ref 70–99)
Potassium: 4.2 mmol/L (ref 3.5–5.1)
Sodium: 138 mmol/L (ref 135–145)
Total Bilirubin: 0.4 mg/dL (ref 0.3–1.2)
Total Protein: 7.8 g/dL (ref 6.5–8.1)

## 2019-07-20 LAB — GENETIC SCREENING ORDER

## 2019-07-20 NOTE — Progress Notes (Signed)
REFERRING PROVIDER: Truitt Merle, MD [place],  [place] [address]  PRIMARY PROVIDER:  Katherina Mires, MD  PRIMARY REASON FOR VISIT:  1. Ductal carcinoma in situ (DCIS) of left breast   2. Family history of breast cancer   3. Family history of prostate cancer   4. Family history of pancreatic cancer      I connected with Ms. Ridley on 07/20/2019 at 2:45 pm EDT by Webex video conference and verified that I am speaking with the correct person using two identifiers.   Patient location: [facility], In clinic Provider location: [facility] office  HISTORY OF PRESENT ILLNESS:   Ms. Witman, a 79 y.o. female, was seen for a [facility] cancer genetics consultation at the request of Dr. Burr Medico due to a personal and family history of breast cancer, as well as a family history of prostate and pancreatic cancer.  Ms. Virag presents to clinic today to discuss the possibility of a hereditary predisposition to cancer, genetic testing, and to further clarify her future cancer risks, as well as potential cancer risks for family members.   In 2003, at the age of 60, Ms. Machnik was diagnosed with cancer of the right breast. The treatment plan included a right mastectomy, chemotherapy, and radiation.  In 2021, at the age of 64, Ms. 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   CANCER HISTORY:  Oncology History Overview Note  Cancer Staging No matching staging information was found for the patient.    Ductal carcinoma in situ (DCIS) of left breast  07/04/2019 Mammogram    Diagnostic Mammogram 07/04/19 IMPRESSION: Indeterminate calcifications in a linear pattern and at least 1 branch point in the upper outer left breast spanning 5 cm.     07/13/2019 Initial Biopsy    Diagnosis 07/13/19 1. Breast, left, needle core biopsy, anterior, UOQ coil clip - DUCTAL CARCINOMA IN SITU WITH CALCIFICATIONS, SEE COMMENT. - USUAL DUCTAL HYPERPLASIA. 2. Breast, left, needle core  biopsy, posterior, UOQ X clip - ATYPICAL DUCTAL HYPERPLASIA WITH CALCIFICATIONS.   07/13/2019 Receptors her2    1. PROGNOSTIC INDICATORS Results: IMMUNOHISTOCHEMICAL AND MORPHOMETRIC ANALYSIS PERFORMED MANUALLY Estrogen Receptor: 95%, POSITIVE, STRONG STAINING INTENSITY Progesterone Receptor: 90%, POSITIVE, STRONG STAINING INTENSITY   07/18/2019 Initial Diagnosis   Ductal carcinoma in situ (DCIS) of left breast      RISK FACTORS:  Menarche was at age 53.  First live birth at age 5.  OCP use for approximately 0 years.  Ovaries intact: yes.  Hysterectomy: no.  Menopausal status: postmenopausal.  HRT use: 0 years. Colonoscopy: yes; normal, per patient. Mammogram within the last year: yes. Number of breast biopsies: 4. Any excessive radiation exposure in the past: radiation treatment for right breast cancer in 2003  Past Medical History:  Diagnosis Date  . Family history of breast cancer   . Family history of pancreatic cancer   . Family history of prostate cancer   . Hypertension   . Personal history of chemotherapy   . Personal history of radiation therapy     Past Surgical History:  Procedure Laterality Date  . MASTECTOMY Right 2014  . right knee replacement  2007    Social History   Socioeconomic History  . Marital status: Single    Spouse name: Not on file  . Number of children: 2  . Years of education: Not on file  . Highest education level: Not on file  Occupational History  . Occupation: retired  Tobacco Use  . Smoking status: Never Smoker  . Smokeless tobacco: Never Used  Substance and Sexual Activity  . Alcohol use: Never  . Drug use: Not on file  . Sexual activity: Not on file  Other Topics Concern  . Not on file  Social History Narrative  . Not on file   Social Determinants of Health   Financial Resource Strain:   . Difficulty of Paying Living Expenses: Not on file  Food Insecurity:   . Worried About Charity fundraiser in the Last  Year: Not on file  . Ran Out of Food in the Last Year: Not on file  Transportation Needs:   . Lack of Transportation (Medical): Not on file  . Lack of Transportation (Non-Medical): Not on file  Physical Activity:   . Days of Exercise per Week: Not on file  . Minutes of Exercise per Session: Not on file  Stress:   . Feeling of Stress : Not on file  Social Connections:   . Frequency of Communication with Friends and Family: Not on file  . Frequency of Social Gatherings with Friends and Family: Not on file  . Attends Religious Services: Not on file  . Active Member of Clubs or Organizations: Not on file  . Attends Archivist Meetings: Not on file  . Marital Status: Not on file     FAMILY HISTORY:  We obtained a detailed, 4-generation family history.  Significant diagnoses are listed below: Family History  Problem Relation Age of Onset  . Breast cancer Mother        dx. 80s or 26s  . Breast cancer Sister        dx. 43s  . Prostate cancer Brother        dx. 57s  . Prostate cancer Brother        dx. 73s  . Pancreatic cancer Cousin        pancreatic cancer in 5 paternal cousins, dx. 24-65  . Breast cancer Sister        dx. 59s   Ms. Schaaf has two daughters, ages 21 and 51. She has two brothers and four sisters. Two of her sisters had breast cancer, both diagnosed in their 35s. Both of her brothers have had prostate cancer, diagnosed in their 3s. Two of her nieces have had breast cancer, one diagnosed in her 21s, and one diagnosed in her 37s. She notes that three of her nieces have had genetic testing that was normal.  Ms. Caillouet mother died at age 6 and was diagnosed with breast cancer in her 76s or 27s. Ms. Loser had three maternal aunts and one maternal uncle, none of whom had cancer. Her grandmother died at age 26 and did not have cancer.  Ms. Carreras's father died in his 34s and did not have cancer. She had two paternal aunts and one paternal uncle, none of whom had  cancer. She is unsure how old her paternal grandparents were when they died.  Ms. Landstrom also notes that she had five cousins who had pancreatic cancer, all diagnosed between ages 41 and 34.   Ms. Barbee is aware of previous family history of genetic testing for hereditary cancer risks, which has been normal in three of her nieces (one of whom had cancer). Her maternal ancestors are of [place] American and [place] descent, and paternal ancestors are of African American descent. There is no reported Ashkenazi Jewish ancestry. There is no known consanguinity.  GENETIC COUNSELING ASSESSMENT:  Ms. Wojtas is a 79 y.o. female with a significant personal and family history of breast cancer, some diagnosed at young ages, and a family history of prostate and pancreatic cancer, which is suggestive of a hereditary cancer syndrome and predisposition to cancer. We, therefore, discussed and recommended the following at today's visit.   DISCUSSION: We discussed that approximately 5-10% of breast cancer is hereditary, with most cases associated with the BRCA genes.  There are other genes that can be associated with hereditary breast cancer syndromes.  These include ATM, CHEK2, PALB2, etc.  We discussed that testing is beneficial for several reasons including knowing about other cancer risks, identifying potential screening and risk-reduction options that may be appropriate, and to understand if other family members could be at risk for cancer and allow them to undergo genetic testing.   We reviewed the characteristics, features and inheritance patterns of hereditary cancer syndromes. We also discussed genetic testing, including the appropriate family members to test, the process of testing, insurance coverage and turn-around-time for results. We discussed the implications of a negative, positive and/or variant of uncertain significant result. We recommended Ms. Kisamore pursue genetic testing for the Common Hereditary Cancers  gene panel.   The Common Hereditary Cancers Panel offered by [facility] includes sequencing and/or deletion duplication testing of the following 48 genes: APC, ATM, AXIN2, BARD1, BMPR1A, BRCA1, BRCA2, BRIP1, CDH1, CDK4, CDKN2A (p14ARF), CDKN2A (p16INK4a), CHEK2, CTNNA1, DICER1, EPCAM (Deletion/duplication testing only), GREM1 (promoter region deletion/duplication testing only), KIT, MEN1, MLH1, MSH2, MSH3, MSH6, MUTYH, NBN, NF1, NHTL1, PALB2, PDGFRA, PMS2, POLD1, POLE, PTEN, RAD50, RAD51C, RAD51D, RNF43, SDHB, SDHC, SDHD, SMAD4, SMARCA4. STK11, TP53, TSC1, TSC2, and VHL.  The following genes were evaluated for sequence changes only: SDHA and HOXB13 c.251G>A variant only.   Based on Ms. Wiler's personal and family history of cancer, she meets medical criteria for genetic testing. Despite that she meets criteria, she may still have an out of pocket cost. We discussed that if her out of pocket cost for testing is over $100, the laboratory will call and confirm whether she wants to proceed with testing.  If the out of pocket cost of testing is less than $100 she will be billed by the genetic testing laboratory.   PLAN: After considering the risks, benefits, and limitations, Ms. Balaban provided informed consent to pursue genetic testing and the blood sample was sent to [facility] for analysis of the Common Hereditary Cancers panel. Results should be available within approximately two-three weeks' time, at which point they will be disclosed by telephone to Ms. Laurance Flatten, as will any additional recommendations warranted by these results. Ms. Collett will receive a summary of her genetic counseling visit and a copy of her results once available. This information will also be available in Epic.   Lastly, we encouraged Ms. Cassel to remain in contact with cancer genetics annually so that we can continuously update the family history and inform her of any changes in cancer genetics and testing that may be of benefit for  this family.   Ms. Nowack questions were answered to her satisfaction today. Our contact information was provided should additional questions or concerns arise. Thank you for the referral and allowing [place] to share in the care of your patient.   Clint Guy, MS, [facility] Certified Genetic Counselor [email or web address] Phone: [phone]  The patient was seen for a total of 20 minutes in face-to-face genetic counseling.  This patient was discussed with Drs. Magrinat, Lindi Adie and/or Burr Medico who agrees with the  above.    _______________________________________________________________________ For Office Staff:  Number of people involved in session: 3 Was an Intern/ student involved with case: no

## 2019-07-22 ENCOUNTER — Encounter: Payer: Self-pay | Admitting: General Practice

## 2019-07-22 NOTE — Progress Notes (Signed)
[facility] Psychosocial Distress Screening Spiritual Care  Followed up with Patricia Matthews by phone after Breast Multidisciplinary Clinic to introduce Support Center team/resources, reviewing distress screen per protocol.  The patient scored a 8 on the Psychosocial Distress Thermometer which indicates severe distress. Also assessed for distress and other psychosocial needs.   ONCBCN DISTRESS SCREENING 07/22/2019  Screening Type Initial Screening  Distress experienced in past week (1-10) 8  Emotional problem type Depression;Nervousness/Anxiety;Adjusting to illness;Isolation/feeling alone;Feeling hopeless;Boredom;Adjusting to appearance changes  Spiritual/Religous concerns type Loss of sense of purpose  Information Concerns Type Lack of info about diagnosis;Lack of info about treatment;Lack of info about complementary therapy choices  Physical Problem type Pain  Referral to support programs Yes   Ms. Dufford reports that her distress is now down to 4-5. She is ready to take action! She is also using perspective and gratitude to cope, and has two different grief support groups to help her with the loss of her husband. Another source of meaning and purpose for her is supporting friends through their own stressors.  Follow up needed: No. Per Ms Wilborn, she has all the support she needs right now, but she is aware of ongoing Support Center team/programming availability in the event that need/desire arises.   [address] [place], [place], [facility] Pager [phone] Voicemail [phone]

## 2019-07-26 ENCOUNTER — Telehealth: Payer: Self-pay | Admitting: *Deleted

## 2019-07-26 NOTE — Telephone Encounter (Signed)
Left vm regarding [facility] from 1.20.21. Contact information provided for questions or needs.  

## 2019-07-27 ENCOUNTER — Telehealth: Payer: Self-pay

## 2019-07-27 NOTE — Telephone Encounter (Signed)
Nutrition Assessment  Reason for Assessment:  Pt attended Breast Clinic on 07/20/19 and was given nutrition packet by nurse navigator  ASSESSMENT: 79 year old female with new diagnosis of left breast cancer.  Previous right breast cancer in 2003.  Planning mastectomy  Spoke with patient via phone to introduce self and service at [facility].  Patient denies issues with appetite   Medications:  reviewed  Labs: reviewed  Anthropometrics:   Height: 63 inches Weight: 193 lb BMI: 34   NUTRITION DIAGNOSIS: Food and nutrition related knowledge deficit related to new diagnosis of breast cancer as evidenced by no prior need for nutrition related information.  INTERVENTION:   Discussed briefly packet of information regarding nutritional tips for breast cancer patients.  No questions at this time. Contact information provided and patient knows to contact me with questions/concerns.    MONITORING, EVALUATION, and GOAL: Pt will consume a healthy plant based diet to maintain lean body mass throughout treatment.   Patricia Baldree B. Freida Matthews, RD, LDN Registered Dietitian [phone] (pager)

## 2019-07-28 ENCOUNTER — Telehealth: Payer: Self-pay | Admitting: Hematology

## 2019-07-28 ENCOUNTER — Encounter: Payer: Self-pay | Admitting: *Deleted

## 2019-07-28 NOTE — Telephone Encounter (Signed)
I left a message regarding 2/22

## 2019-08-01 ENCOUNTER — Telehealth: Payer: Self-pay | Admitting: Genetic Counselor

## 2019-08-01 ENCOUNTER — Encounter: Payer: Self-pay | Admitting: Genetic Counselor

## 2019-08-01 ENCOUNTER — Ambulatory Visit: Payer: Self-pay | Admitting: Genetic Counselor

## 2019-08-01 DIAGNOSIS — Z1379 Encounter for other screening for genetic and chromosomal anomalies: Secondary | ICD-10-CM | POA: Insufficient documentation

## 2019-08-01 NOTE — Telephone Encounter (Signed)
Revealed negative genetic testing.  Discussed that we do not know why she has breast cancer in both breasts, or why there is cancer in the family.  It could be due to a different gene that we are not testing, or our current technology may not be able detect something.  It will be important for her to keep in contact with genetics to keep up with whether additional testing may be appropriate in the future.

## 2019-08-01 NOTE — Progress Notes (Signed)
HPI:  [name] was previously seen in the [place] clinic due to a personal history of bilateral breast cancer as well as a family history of breast, prostate, and pancreatic cancer and concerns regarding a hereditary predisposition to cancer. Please refer to our prior cancer genetics clinic note for more information regarding our discussion, assessment and recommendations, at the time. [name] recent genetic test results were disclosed to her, as were recommendations warranted by these results. These results and recommendations are discussed in more detail below.  CANCER HISTORY:  Oncology History Overview Note  Cancer Staging No matching staging information was found for the patient.    Ductal carcinoma in situ (DCIS) of left breast  07/04/2019 Mammogram    Diagnostic Mammogram 07/04/19 IMPRESSION: Indeterminate calcifications in a linear pattern and at least 1 branch point in the upper outer left breast spanning 5 cm.     07/13/2019 Initial Biopsy    Diagnosis 07/13/19 1. Breast, left, needle core biopsy, anterior, UOQ coil clip - DUCTAL CARCINOMA IN SITU WITH CALCIFICATIONS, SEE COMMENT. - USUAL DUCTAL HYPERPLASIA. 2. Breast, left, needle core biopsy, posterior, UOQ X clip - ATYPICAL DUCTAL HYPERPLASIA WITH CALCIFICATIONS.   07/13/2019 Receptors her2    1. PROGNOSTIC INDICATORS Results: IMMUNOHISTOCHEMICAL AND MORPHOMETRIC ANALYSIS PERFORMED MANUALLY Estrogen Receptor: 95%, POSITIVE, STRONG STAINING INTENSITY Progesterone Receptor: 90%, POSITIVE, STRONG STAINING INTENSITY   07/18/2019 Initial Diagnosis   Ductal carcinoma in situ (DCIS) of left breast   08/01/2019 Genetic Testing   Negative genetic testing:  No pathogenic variants detected on the [facility] Breast Cancer STAT panel or Common Hereditary Cancers panel. The report date is 2/1//2021.  The STAT Breast cancer panel offered by [facility] includes sequencing and rearrangement analysis for the following 9  genes:  ATM, BRCA1, BRCA2, CDH1, CHEK2, PALB2, PTEN, STK11 and TP53.  The Common Hereditary Cancers Panel offered by [facility] includes sequencing and/or deletion duplication testing of the following 48 genes: APC, ATM, AXIN2, BARD1, BMPR1A, BRCA1, BRCA2, BRIP1, CDH1, CDK4, CDKN2A (p14ARF), CDKN2A (p16INK4a), CHEK2, CTNNA1, DICER1, EPCAM (Deletion/duplication testing only), GREM1 (promoter region deletion/duplication testing only), KIT, MEN1, MLH1, MSH2, MSH3, MSH6, MUTYH, NBN, NF1, NHTL1, PALB2, PDGFRA, PMS2, POLD1, POLE, PTEN, RAD50, RAD51C, RAD51D, RNF43, SDHB, SDHC, SDHD, SMAD4, SMARCA4. STK11, TP53, TSC1, TSC2, and VHL.  The following genes were evaluated for sequence changes only: SDHA and HOXB13 c.251G>A variant only.      FAMILY HISTORY:  We obtained a detailed, 4-generation family history.  Significant diagnoses are listed below: Family History  Problem Relation Age of Onset  . Breast cancer Mother        dx. 66s or 71s  . Breast cancer Sister        dx. 29s  . Prostate cancer Brother        dx. 26s  . Prostate cancer Brother        dx. 76s  . Pancreatic cancer Cousin        pancreatic cancer in 5 maternal cousins, dx. 35-65  . Breast cancer Sister        dx. 75s  . Breast cancer Niece        dx. 85s  . Breast cancer Niece        dx. 90s  . Pancreatic cancer Niece   . Pancreatic cancer Niece    [name] has two daughters, ages 80 and 23. She has two brothers and four sisters. Two of her sisters had breast cancer, both diagnosed in their 27s. Both  of her brothers have had prostate cancer, diagnosed in their 39s. Two of her nieces have had breast cancer, one diagnosed in her 69s, and one diagnosed in her 66s. She notes that three of her nieces have had genetic testing that was normal. Additionally, two nieces have had pancreatic cancer, one having had surgery last week.  [name] mother died at age 59 and was diagnosed with breast cancer in her 75s or 87s. [name] had three  maternal aunts and one maternal uncle, none of whom had cancer. Her grandmother died at age 29 and did not have cancer.  [name]'s father died in his 80s and did not have cancer. She had two paternal aunts and one paternal uncle, none of whom had cancer. She is unsure how old her paternal grandparents were when they died.  [name] also notes that she had five maternal cousins who had pancreatic cancer, all diagnosed between ages 42 and 58.   [name] is aware of previous family history of genetic testing for hereditary cancer risks, which has been normal in three of her nieces (one of whom had cancer). Her maternal ancestors are of [place] American and [place] descent, and paternal ancestors are of African American descent. There is no reported Ashkenazi Jewish ancestry. There is no known consanguinity.  GENETIC TEST RESULTS: Genetic testing reported out on 08/01/2019 through the [facility] Breast Cancer STAT panel and Common Hereditary Cancers panel. No pathogenic variants were detected.   The STAT Breast cancer panel offered by [facility] includes sequencing and rearrangement analysis for the following 9 genes:  ATM, BRCA1, BRCA2, CDH1, CHEK2, PALB2, PTEN, STK11 and TP53.  The Common Hereditary Cancers Panel offered by [facility] includes sequencing and/or deletion duplication testing of the following 48 genes: APC, ATM, AXIN2, BARD1, BMPR1A, BRCA1, BRCA2, BRIP1, CDH1, CDK4, CDKN2A (p14ARF), CDKN2A (p16INK4a), CHEK2, CTNNA1, DICER1, EPCAM (Deletion/duplication testing only), GREM1 (promoter region deletion/duplication testing only), KIT, MEN1, MLH1, MSH2, MSH3, MSH6, MUTYH, NBN, NF1, NHTL1, PALB2, PDGFRA, PMS2, POLD1, POLE, PTEN, RAD50, RAD51C, RAD51D, RNF43, SDHB, SDHC, SDHD, SMAD4, SMARCA4. STK11, TP53, TSC1, TSC2, and VHL.  The following genes were evaluated for sequence changes only: SDHA and HOXB13 c.251G>A variant only. The test report will be scanned into EPIC and located under the Molecular  Pathology section of the Results Review tab.  A portion of the result report is included below for reference.     We discussed with [name] that because current genetic testing is not perfect, it is possible there may be a gene mutation in one of these genes that current testing cannot detect, but that chance is small.  We also discussed, that there could be another gene that has not yet been discovered, or that we have not yet tested, that is responsible for the cancer diagnoses in the family. It is also possible there is a hereditary cause for the cancer in the family that [name] did not inherit and therefore was not identified in her testing.  Therefore, it is important to remain in touch with cancer genetics in the future so that we can continue to offer [name] the most up to date genetic testing.   CANCER SCREENING RECOMMENDATIONS: [name] test result is considered negative (normal).  This means that we have not identified a hereditary cause for her personal and family history of cancer at this time.  Given [name]'s personal and family histories, we must interpret these negative results with some caution.  Families with features  suggestive of hereditary risk for cancer tend to have multiple family members with cancer, diagnoses in multiple generations and diagnoses before the age of 107. [name] family exhibits some of these features. Thus, this result may simply reflect our current inability to detect all mutations within these genes or there may be a different gene that has not yet been discovered or tested.   An individual's cancer risk and medical management are not determined by genetic test results alone. Overall cancer risk assessment incorporates additional factors, including personal medical history, family history, and any available genetic information that may result in a personalized plan for cancer prevention and surveillance.  RECOMMENDATIONS FOR FAMILY MEMBERS:   Individuals in this family might be at some increased risk of developing cancer, over the general population risk, simply due to the family history of cancer.  We recommended women in this family have a yearly mammogram beginning at age 76, or 61 years younger than the earliest onset of cancer, an annual clinical breast exam, and perform monthly breast self-exams. Women in this family should also have a gynecological exam as recommended by their primary provider. All family members should have a colonoscopy by age 77.  It is also possible there is a hereditary cause for the cancer in [name]'s family that she did not inherit and therefore was not identified in her.  Based on [name]'s family history, we recommended her living family members who have been diagnosed with breast, prostate, or pancreatic cancer have genetic counseling and testing. [name] will let us know if we can be of any assistance in coordinating genetic counseling and/or testing for this family member.   FOLLOW-UP: Lastly, we discussed with [name] that cancer genetics is a rapidly advancing field and it is possible that new genetic tests will be appropriate for her and/or her family members in the future. We encouraged her to remain in contact with cancer genetics on an annual basis so we can update her personal and family histories and let her know of advances in cancer genetics that may benefit this family.   Our contact number was provided. [name] questions were answered to her satisfaction, and she knows she is welcome to call us at anytime with additional questions or concerns.   Clint Guy, MS, [facility] Licensed Certified Genetic Counselor [email or web address] Phone: [phone]

## 2019-08-03 ENCOUNTER — Encounter (HOSPITAL_BASED_OUTPATIENT_CLINIC_OR_DEPARTMENT_OTHER): Payer: Self-pay | Admitting: General Surgery

## 2019-08-03 ENCOUNTER — Other Ambulatory Visit: Payer: Self-pay

## 2019-08-05 ENCOUNTER — Encounter (HOSPITAL_BASED_OUTPATIENT_CLINIC_OR_DEPARTMENT_OTHER)
Admission: RE | Admit: 2019-08-05 | Discharge: 2019-08-05 | Disposition: A | Discharge status: Home / Self Care | Payer: Medicare Other | Source: Ambulatory Visit | Attending: General Surgery | Admitting: General Surgery

## 2019-08-05 ENCOUNTER — Other Ambulatory Visit: Payer: Self-pay

## 2019-08-05 DIAGNOSIS — Z01810 Encounter for preprocedural cardiovascular examination: Secondary | ICD-10-CM | POA: Diagnosis present

## 2019-08-05 NOTE — Progress Notes (Signed)
EKG reviewed by Dr. Howze, will proceed with surgery as scheduled.  

## 2019-08-05 NOTE — Progress Notes (Signed)

## 2019-08-06 ENCOUNTER — Other Ambulatory Visit (HOSPITAL_COMMUNITY)
Admission: RE | Admit: 2019-08-06 | Discharge: 2019-08-06 | Disposition: A | Discharge status: Home / Self Care | Payer: Medicare Other | Source: Ambulatory Visit | Attending: General Surgery | Admitting: General Surgery

## 2019-08-06 DIAGNOSIS — Z20822 Contact with and (suspected) exposure to covid-19: Secondary | ICD-10-CM | POA: Diagnosis not present

## 2019-08-06 DIAGNOSIS — Z01812 Encounter for preprocedural laboratory examination: Secondary | ICD-10-CM | POA: Diagnosis present

## 2019-08-06 LAB — SARS CORONAVIRUS 2 (TAT 6-24 HRS): SARS Coronavirus 2: NEGATIVE

## 2019-08-09 NOTE — Anesthesia Preprocedure Evaluation (Addendum)
Anesthesia Evaluation  Patient identified by MRN, date of birth, ID band Patient awake    Reviewed: Allergy & Precautions, NPO status , Patient's Chart, lab work & pertinent test results  Airway Mallampati: II  TM Distance: >3 FB     Dental   Pulmonary    breath sounds clear to auscultation       Cardiovascular hypertension,  Rhythm:Regular Rate:Normal     Neuro/Psych    GI/Hepatic Neg liver ROS, GERD  ,  Endo/Other  negative endocrine ROS  Renal/GU negative Renal ROS     Musculoskeletal   Abdominal   Peds  Hematology   Anesthesia Other Findings   Reproductive/Obstetrics                            Anesthesia Physical Anesthesia Plan  ASA: III  Anesthesia Plan: General   Post-op Pain Management:    Induction:   PONV Risk Score and Plan: 3 and Ondansetron, Dexamethasone and Midazolam  Airway Management Planned: LMA  Additional Equipment:   Intra-op Plan:   Post-operative Plan: Extubation in OR  Informed Consent: I have reviewed the patients History and Physical, chart, labs and discussed the procedure including the risks, benefits and alternatives for the proposed anesthesia with the patient or authorized representative who has indicated his/her understanding and acceptance.     Dental advisory given  Plan Discussed with: Anesthesiologist and CRNA  Anesthesia Plan Comments:        Anesthesia Quick Evaluation

## 2019-08-10 ENCOUNTER — Encounter (HOSPITAL_BASED_OUTPATIENT_CLINIC_OR_DEPARTMENT_OTHER)
Admission: RE | Disposition: A | Discharge status: Home / Self Care | Payer: Self-pay | Source: Home / Self Care | Attending: General Surgery

## 2019-08-10 ENCOUNTER — Other Ambulatory Visit: Payer: Self-pay

## 2019-08-10 ENCOUNTER — Encounter (HOSPITAL_BASED_OUTPATIENT_CLINIC_OR_DEPARTMENT_OTHER): Payer: Self-pay | Admitting: General Surgery

## 2019-08-10 ENCOUNTER — Ambulatory Visit (HOSPITAL_BASED_OUTPATIENT_CLINIC_OR_DEPARTMENT_OTHER)
Admission: RE | Admit: 2019-08-10 | Discharge: 2019-08-10 | Disposition: A | Discharge status: Home / Self Care | Payer: Medicare Other | Attending: General Surgery | Admitting: General Surgery

## 2019-08-10 ENCOUNTER — Ambulatory Visit (HOSPITAL_BASED_OUTPATIENT_CLINIC_OR_DEPARTMENT_OTHER): Payer: Medicare Other | Admitting: Anesthesiology

## 2019-08-10 DIAGNOSIS — K219 Gastro-esophageal reflux disease without esophagitis: Secondary | ICD-10-CM | POA: Insufficient documentation

## 2019-08-10 DIAGNOSIS — Z8041 Family history of malignant neoplasm of ovary: Secondary | ICD-10-CM | POA: Diagnosis not present

## 2019-08-10 DIAGNOSIS — Z170 Estrogen receptor positive status [ER+]: Secondary | ICD-10-CM | POA: Insufficient documentation

## 2019-08-10 DIAGNOSIS — Z923 Personal history of irradiation: Secondary | ICD-10-CM | POA: Insufficient documentation

## 2019-08-10 DIAGNOSIS — F329 Major depressive disorder, single episode, unspecified: Secondary | ICD-10-CM | POA: Diagnosis not present

## 2019-08-10 DIAGNOSIS — Z8261 Family history of arthritis: Secondary | ICD-10-CM | POA: Diagnosis not present

## 2019-08-10 DIAGNOSIS — I10 Essential (primary) hypertension: Secondary | ICD-10-CM | POA: Diagnosis not present

## 2019-08-10 DIAGNOSIS — Z800 Family history of malignant neoplasm of digestive organs: Secondary | ICD-10-CM | POA: Insufficient documentation

## 2019-08-10 DIAGNOSIS — Z8042 Family history of malignant neoplasm of prostate: Secondary | ICD-10-CM | POA: Insufficient documentation

## 2019-08-10 DIAGNOSIS — Z9841 Cataract extraction status, right eye: Secondary | ICD-10-CM | POA: Diagnosis not present

## 2019-08-10 DIAGNOSIS — M1990 Unspecified osteoarthritis, unspecified site: Secondary | ICD-10-CM | POA: Diagnosis not present

## 2019-08-10 DIAGNOSIS — Z9842 Cataract extraction status, left eye: Secondary | ICD-10-CM | POA: Diagnosis not present

## 2019-08-10 DIAGNOSIS — Z853 Personal history of malignant neoplasm of breast: Secondary | ICD-10-CM | POA: Diagnosis not present

## 2019-08-10 DIAGNOSIS — D0512 Intraductal carcinoma in situ of left breast: Secondary | ICD-10-CM | POA: Insufficient documentation

## 2019-08-10 DIAGNOSIS — Z9221 Personal history of antineoplastic chemotherapy: Secondary | ICD-10-CM | POA: Diagnosis not present

## 2019-08-10 DIAGNOSIS — Z8249 Family history of ischemic heart disease and other diseases of the circulatory system: Secondary | ICD-10-CM | POA: Insufficient documentation

## 2019-08-10 DIAGNOSIS — Z823 Family history of stroke: Secondary | ICD-10-CM | POA: Insufficient documentation

## 2019-08-10 DIAGNOSIS — Z803 Family history of malignant neoplasm of breast: Secondary | ICD-10-CM | POA: Diagnosis not present

## 2019-08-10 DIAGNOSIS — Z9011 Acquired absence of right breast and nipple: Secondary | ICD-10-CM | POA: Insufficient documentation

## 2019-08-10 DIAGNOSIS — Z8049 Family history of malignant neoplasm of other genital organs: Secondary | ICD-10-CM | POA: Insufficient documentation

## 2019-08-10 HISTORY — PX: TOTAL MASTECTOMY: SHX6129

## 2019-08-10 HISTORY — DX: Gastro-esophageal reflux disease without esophagitis: K21.9

## 2019-08-10 SURGERY — MASTECTOMY, SIMPLE
Anesthesia: General | Site: Breast | Laterality: Left

## 2019-08-10 MED ORDER — CELECOXIB 200 MG PO CAPS
200.0000 mg | ORAL_CAPSULE | ORAL | Status: AC
  Administered 2019-08-10: 200 mg via ORAL

## 2019-08-10 MED ORDER — FENTANYL CITRATE (PF) 100 MCG/2ML IJ SOLN
INTRAMUSCULAR | Status: AC
  Filled 2019-08-10: qty 2

## 2019-08-10 MED ORDER — PROPOFOL 500 MG/50ML IV EMUL
INTRAVENOUS | Status: AC
  Filled 2019-08-10: qty 50

## 2019-08-10 MED ORDER — DEXAMETHASONE SODIUM PHOSPHATE 10 MG/ML IJ SOLN
INTRAMUSCULAR | Status: DC | PRN
  Administered 2019-08-10: 5 mg via INTRAVENOUS

## 2019-08-10 MED ORDER — PROPOFOL 10 MG/ML IV BOLUS
INTRAVENOUS | Status: DC | PRN
  Administered 2019-08-10: 150 mg via INTRAVENOUS

## 2019-08-10 MED ORDER — LACTATED RINGERS IV SOLN: INTRAVENOUS | Status: DC

## 2019-08-10 MED ORDER — FENTANYL CITRATE (PF) 100 MCG/2ML IJ SOLN
50.0000 ug | INTRAMUSCULAR | Status: AC | PRN
  Administered 2019-08-10: 09:00:00 25 ug via INTRAVENOUS
  Administered 2019-08-10: 10:00:00 50 ug via INTRAVENOUS
  Administered 2019-08-10: 25 ug via INTRAVENOUS
  Administered 2019-08-10: 50 ug via INTRAVENOUS
  Administered 2019-08-10 (×2): 25 ug via INTRAVENOUS

## 2019-08-10 MED ORDER — ESMOLOL HCL 100 MG/10ML IV SOLN
INTRAVENOUS | Status: DC | PRN
  Administered 2019-08-10 (×2): 10 mg via INTRAVENOUS

## 2019-08-10 MED ORDER — CEFAZOLIN SODIUM-DEXTROSE 2-4 GM/100ML-% IV SOLN
INTRAVENOUS | Status: AC
  Filled 2019-08-10: qty 100

## 2019-08-10 MED ORDER — BUPIVACAINE HCL (PF) 0.25 % IJ SOLN
INTRAMUSCULAR | Status: AC
  Filled 2019-08-10: qty 60

## 2019-08-10 MED ORDER — ONDANSETRON HCL 4 MG/2ML IJ SOLN
INTRAMUSCULAR | Status: DC | PRN
  Administered 2019-08-10: 4 mg via INTRAVENOUS

## 2019-08-10 MED ORDER — METHOCARBAMOL 750 MG PO TABS: 750.0000 mg | ORAL_TABLET | Freq: Four times a day (QID) | ORAL | 2 refills | Status: AC | PRN

## 2019-08-10 MED ORDER — BUPIVACAINE LIPOSOME 1.3 % IJ SUSP
INTRAMUSCULAR | Status: DC | PRN
  Administered 2019-08-10: 10 mL

## 2019-08-10 MED ORDER — LABETALOL HCL 5 MG/ML IV SOLN
INTRAVENOUS | Status: DC | PRN
  Administered 2019-08-10: 5 mg via INTRAVENOUS

## 2019-08-10 MED ORDER — CELECOXIB 200 MG PO CAPS
ORAL_CAPSULE | ORAL | Status: AC
  Filled 2019-08-10: qty 1

## 2019-08-10 MED ORDER — BUPIVACAINE LIPOSOME 1.3 % IJ SUSP
INTRAMUSCULAR | Status: AC
  Filled 2019-08-10: qty 20

## 2019-08-10 MED ORDER — OXYCODONE HCL 5 MG PO TABS
ORAL_TABLET | ORAL | Status: AC
  Filled 2019-08-10: qty 1

## 2019-08-10 MED ORDER — MIDAZOLAM HCL 2 MG/2ML IJ SOLN
INTRAMUSCULAR | Status: AC
  Filled 2019-08-10: qty 2

## 2019-08-10 MED ORDER — CHLORHEXIDINE GLUCONATE CLOTH 2 % EX PADS: 6.0000 | MEDICATED_PAD | Freq: Once | CUTANEOUS | Status: DC

## 2019-08-10 MED ORDER — ACETAMINOPHEN 500 MG PO TABS
1000.0000 mg | ORAL_TABLET | ORAL | Status: AC
  Administered 2019-08-10: 1000 mg via ORAL

## 2019-08-10 MED ORDER — LIDOCAINE-EPINEPHRINE 1 %-1:100000 IJ SOLN
INTRAMUSCULAR | Status: AC
  Filled 2019-08-10: qty 1

## 2019-08-10 MED ORDER — PHENYLEPHRINE 40 MCG/ML (10ML) SYRINGE FOR IV PUSH (FOR BLOOD PRESSURE SUPPORT)
PREFILLED_SYRINGE | INTRAVENOUS | Status: DC | PRN
  Administered 2019-08-10 (×2): 80 ug via INTRAVENOUS

## 2019-08-10 MED ORDER — GABAPENTIN 300 MG PO CAPS
ORAL_CAPSULE | ORAL | Status: AC
  Filled 2019-08-10: qty 1

## 2019-08-10 MED ORDER — EPHEDRINE SULFATE-NACL 50-0.9 MG/10ML-% IV SOSY
PREFILLED_SYRINGE | INTRAVENOUS | Status: DC | PRN
  Administered 2019-08-10 (×2): 10 mg via INTRAVENOUS

## 2019-08-10 MED ORDER — HYDROCODONE-ACETAMINOPHEN 5-325 MG PO TABS: 1.0000 | ORAL_TABLET | Freq: Four times a day (QID) | ORAL | 0 refills | Status: AC | PRN

## 2019-08-10 MED ORDER — LIDOCAINE 2% (20 MG/ML) 5 ML SYRINGE
INTRAMUSCULAR | Status: DC | PRN
  Administered 2019-08-10: 80 mg via INTRAVENOUS

## 2019-08-10 MED ORDER — GABAPENTIN 300 MG PO CAPS
300.0000 mg | ORAL_CAPSULE | ORAL | Status: AC
  Administered 2019-08-10: 300 mg via ORAL

## 2019-08-10 MED ORDER — OXYCODONE HCL 5 MG PO TABS
5.0000 mg | ORAL_TABLET | Freq: Once | ORAL | Status: AC
  Administered 2019-08-10: 5 mg via ORAL

## 2019-08-10 MED ORDER — MIDAZOLAM HCL 2 MG/2ML IJ SOLN: 1.0000 mg | INTRAMUSCULAR | Status: DC | PRN

## 2019-08-10 MED ORDER — CEFAZOLIN SODIUM-DEXTROSE 2-4 GM/100ML-% IV SOLN
2.0000 g | INTRAVENOUS | Status: AC
  Administered 2019-08-10: 2 g via INTRAVENOUS

## 2019-08-10 MED ORDER — ACETAMINOPHEN 500 MG PO TABS
ORAL_TABLET | ORAL | Status: AC
  Filled 2019-08-10: qty 2

## 2019-08-10 MED ORDER — FENTANYL CITRATE (PF) 100 MCG/2ML IJ SOLN
25.0000 ug | INTRAMUSCULAR | Status: DC | PRN
  Administered 2019-08-10 (×2): 50 ug via INTRAVENOUS
  Administered 2019-08-10: 25 ug via INTRAVENOUS

## 2019-08-10 SURGICAL SUPPLY — 59 items
ADH SKN CLS APL DERMABOND .7 (GAUZE/BANDAGES/DRESSINGS) ×1
APL PRP STRL LF DISP 70% ISPRP (MISCELLANEOUS) ×1
APPLIER CLIP 9.375 MED OPEN (MISCELLANEOUS) ×3
APR CLP MED 9.3 20 MLT OPN (MISCELLANEOUS) ×1
BINDER BREAST XLRG (GAUZE/BANDAGES/DRESSINGS) ×2 IMPLANT
BIOPATCH RED 1 DISK 7.0 (GAUZE/BANDAGES/DRESSINGS) ×1 IMPLANT
BIOPATCH RED 1IN DISK 7.0MM (GAUZE/BANDAGES/DRESSINGS) ×1
BLADE SURG 10 STRL SS (BLADE) ×3 IMPLANT
BLADE SURG 15 STRL LF DISP TIS (BLADE) ×1 IMPLANT
BLADE SURG 15 STRL SS (BLADE) ×3
CANISTER SUCT 1200ML W/VALVE (MISCELLANEOUS) ×3 IMPLANT
CHLORAPREP W/TINT 26 (MISCELLANEOUS) ×3 IMPLANT
CLIP APPLIE 9.375 MED OPEN (MISCELLANEOUS) ×1 IMPLANT
COVER BACK TABLE 60X90IN (DRAPES) ×3 IMPLANT
COVER MAYO STAND STRL (DRAPES) ×3 IMPLANT
COVER PROBE W GEL 5X96 (DRAPES) ×1 IMPLANT
COVER WAND RF STERILE (DRAPES) IMPLANT
DECANTER SPIKE VIAL GLASS SM (MISCELLANEOUS) IMPLANT
DERMABOND ADVANCED (GAUZE/BANDAGES/DRESSINGS) ×2
DERMABOND ADVANCED .7 DNX12 (GAUZE/BANDAGES/DRESSINGS) ×1 IMPLANT
DEVICE DISSECT PLASMABLAD 3.0S (MISCELLANEOUS) ×1 IMPLANT
DRAIN CHANNEL 19F RND (DRAIN) ×3 IMPLANT
DRAPE LAPAROSCOPIC ABDOMINAL (DRAPES) ×3 IMPLANT
DRAPE SURG 17X23 STRL (DRAPES) ×4 IMPLANT
DRAPE UTILITY XL STRL (DRAPES) ×3 IMPLANT
DRSG PAD ABDOMINAL 8X10 ST (GAUZE/BANDAGES/DRESSINGS) ×2 IMPLANT
ELECT COATED BLADE 2.86 ST (ELECTRODE) ×1 IMPLANT
ELECT REM PT RETURN 9FT ADLT (ELECTROSURGICAL) ×3
ELECTRODE REM PT RTRN 9FT ADLT (ELECTROSURGICAL) ×1 IMPLANT
EVACUATOR SILICONE 100CC (DRAIN) ×3 IMPLANT
GAUZE SPONGE 4X4 12PLY STRL LF (GAUZE/BANDAGES/DRESSINGS) ×7 IMPLANT
GLOVE BIO SURGEON STRL SZ7.5 (GLOVE) ×3 IMPLANT
GLOVE BIOGEL PI IND STRL 7.0 (GLOVE) IMPLANT
GLOVE BIOGEL PI INDICATOR 7.0 (GLOVE) ×4
GLOVE SURG SS PI 7.0 STRL IVOR (GLOVE) ×2 IMPLANT
GOWN STRL REUS W/ TWL LRG LVL3 (GOWN DISPOSABLE) ×2 IMPLANT
GOWN STRL REUS W/TWL LRG LVL3 (GOWN DISPOSABLE) ×6
ILLUMINATOR WAVEGUIDE N/F (MISCELLANEOUS) IMPLANT
LIGHT WAVEGUIDE WIDE FLAT (MISCELLANEOUS) IMPLANT
NDL HYPO 25X1 1.5 SAFETY (NEEDLE) ×1 IMPLANT
NEEDLE HYPO 25X1 1.5 SAFETY (NEEDLE) ×3 IMPLANT
NS IRRIG 1000ML POUR BTL (IV SOLUTION) ×3 IMPLANT
PACK BASIN DAY SURGERY FS (CUSTOM PROCEDURE TRAY) ×3 IMPLANT
PENCIL SMOKE EVACUATOR (MISCELLANEOUS) ×1 IMPLANT
PIN SAFETY STERILE (MISCELLANEOUS) ×1 IMPLANT
PLASMABLADE 3.0S (MISCELLANEOUS) ×3
SLEEVE SCD COMPRESS KNEE MED (MISCELLANEOUS) ×3 IMPLANT
SPONGE LAP 18X18 RF (DISPOSABLE) ×5 IMPLANT
SUT ETHILON 2 0 FS 18 (SUTURE) ×3 IMPLANT
SUT MNCRL AB 4-0 PS2 18 (SUTURE) ×3 IMPLANT
SUT SILK 2 0 SH (SUTURE) ×2 IMPLANT
SUT VIC AB 0 SH 27 (SUTURE) ×2 IMPLANT
SUT VICRYL 3-0 CR8 SH (SUTURE) ×4 IMPLANT
SYR CONTROL 10ML LL (SYRINGE) ×3 IMPLANT
TOWEL GREEN STERILE FF (TOWEL DISPOSABLE) ×3 IMPLANT
TRAY FOLEY W/BAG SLVR 14FR LF (SET/KITS/TRAYS/PACK) IMPLANT
TUBE CONNECTING 20'X1/4 (TUBING) ×1
TUBE CONNECTING 20X1/4 (TUBING) ×2 IMPLANT
YANKAUER SUCT BULB TIP NO VENT (SUCTIONS) ×3 IMPLANT

## 2019-08-10 NOTE — Discharge Instructions (Signed)
Next Dose of Tylenol and Ibuprofen at 1:30 PM   Post Anesthesia Home Care Instructions  Activity: Get plenty of rest for the remainder of the day. A responsible individual must stay with you for 24 hours following the procedure.  For the next 24 hours, DO NOT: -Drive a car -Advertising copywriter -Drink alcoholic beverages -Take any medication unless instructed by your physician -Make any legal decisions or sign important papers.  Meals: Start with liquid foods such as gelatin or soup. Progress to regular foods as tolerated. Avoid greasy, spicy, heavy foods. If nausea and/or vomiting occur, drink only clear liquids until the nausea and/or vomiting subsides. Call your physician if vomiting continues.  Special Instructions/Symptoms: Your throat may feel dry or sore from the anesthesia or the breathing tube placed in your throat during surgery. If this causes discomfort, gargle with warm salt water. The discomfort should disappear within 24 hours.  If you had a scopolamine patch placed behind your ear for the management of post- operative nausea and/or vomiting:  1. The medication in the patch is effective for 72 hours, after which it should be removed.  Wrap patch in a tissue and discard in the trash. Wash hands thoroughly with soap and water. 2. You may remove the patch earlier than 72 hours if you experience unpleasant side effects which may include dry mouth, dizziness or visual disturbances. 3. Avoid touching the patch. Wash your hands with soap and water after contact with the patch.    About my Jackson-Pratt Bulb Drain  What is a Jackson-Pratt bulb? A Jackson-Pratt is a soft, round device used to collect drainage. It is connected to a long, thin drainage catheter, which is held in place by one or two small stiches near your surgical incision site. When the bulb is squeezed, it forms a vacuum, forcing the drainage to empty into the bulb.  Emptying the Jackson-Pratt bulb- To empty the  bulb: 1. Release the plug on the top of the bulb. 2. Pour the bulb's contents into a measuring container which your nurse will provide. 3. Record the time emptied and amount of drainage. Empty the drain(s) as often as your     doctor or nurse recommends.  Date                  Time                    Amount (Drain 1)                 Amount (Drain 2)  _____________________________________________________________________  _____________________________________________________________________  _____________________________________________________________________  _____________________________________________________________________  _____________________________________________________________________  _____________________________________________________________________  _____________________________________________________________________  _____________________________________________________________________  Squeezing the Jackson-Pratt Bulb- To squeeze the bulb: 1. Make sure the plug at the top of the bulb is open. 2. Squeeze the bulb tightly in your fist. You will hear air squeezing from the bulb. 3. Replace the plug while the bulb is squeezed. 4. Use a safety pin to attach the bulb to your clothing. This will keep the catheter from     pulling at the bulb insertion site.  When to call your doctor- Call your doctor if:  Drain site becomes red, swollen or hot.  You have a fever greater than 101 degrees F.  There is oozing at the drain site.  Drain falls out (apply a guaze bandage over the drain hole and secure it with tape).  Drainage increases daily not related to activity patterns. (You will usually have more drainage when you  are active than when you are resting.)  Drainage has a bad odor.      JP Drain [employer] this sheet to all of your post-operative appointments while you have your drains.  Please measure your drains by CC's or ML's.  Make sure you  drain and measure your JP Drains 2 or 3 times per day.  At the end of each day, add up totals for the left side and add up totals for the right side.    ( 9 am )     ( 3 pm )        ( 9 pm )                Date L  R  L  R  L  R  Total L/R

## 2019-08-10 NOTE — Transfer of Care (Signed)
Immediate Anesthesia Transfer of Care Note  Patient: Patricia Matthews  Procedure(s) Performed: LEFT MASTECTOMY (Left Breast)  Patient Location: PACU  Anesthesia Type:General  Level of Consciousness: awake and patient cooperative  Airway & Oxygen Therapy: Patient Spontanous Breathing and Patient connected to face mask oxygen  Post-op Assessment: Report given to RN and Post -op Vital signs reviewed and stable  Post vital signs: Reviewed and stable  Last Vitals:  Vitals Value Taken Time  BP 138/75 08/10/19 1018  Temp    Pulse 73 08/10/19 1023  Resp 17 08/10/19 1023  SpO2 100 % 08/10/19 1023  Vitals shown include unvalidated device data.  Last Pain:  Vitals:   08/10/19 0712  PainSc: 0-No pain      Patients Stated Pain Goal: 4 (08/10/19 9476)  Complications: No apparent anesthesia complications

## 2019-08-10 NOTE — Op Note (Signed)
08/10/2019  10:14 AM  PATIENT:  Patricia Matthews  79 y.o. female  PRE-OPERATIVE DIAGNOSIS:  LEFT BREAST DUCTAL CARCINOMA IN SITU  POST-OPERATIVE DIAGNOSIS:  LEFT BREAST DUCTAL CARCINOMA IN SITU  PROCEDURE:  Procedure(s): LEFT MASTECTOMY (Left)  SURGEON:  Surgeon(s) and Role:    Griselda Miner, MD - Primary  PHYSICIAN ASSISTANT:   ASSISTANTS: none   ANESTHESIA:   local and general  EBL:  10 mL   BLOOD ADMINISTERED:none  DRAINS: (1) Jackson-Pratt drain(s) with closed bulb suction in the prepectoral space   LOCAL MEDICATIONS USED:  OTHER exparel  SPECIMEN:  Source of Specimen:  left mastectomy  DISPOSITION OF SPECIMEN:  PATHOLOGY  COUNTS:  YES  TOURNIQUET:  * No tourniquets in log *  DICTATION: .Dragon Dictation   After informed consent was obtained the patient was brought to the operating room placed in the supine position on the operating table.  After adequate induction of general anesthesia the patient's left chest and breast area were prepped with ChloraPrep, allowed to dry, and draped in usual sterile manner.  An appropriate timeout was performed.  An elliptical incision was made around the nipple and areola complex in order to try to minimize the excess skin.  The incision was carried through the skin and subcutaneous tissue sharply with the 10 blade knife and PlasmaBlade.  Breast hooks were then used to elevate the skin flaps anteriorly towards the ceiling.  Thin skin flaps were then created circumferentially by dissecting with the PlasmaBlade between the breast tissue and the subcutaneous fat.  This dissection was carried all the way to the chest wall.  Next the breast was removed from the pectoralis muscle with the pectoralis fascia.  Once this was accomplished the breast was removed from the patient and marked with a stitch on the lateral skin.  Several small vessels were controlled either with clips or with 3-0 Vicryl figure-of-eight stitches.  The wound was irrigated  with copious amounts of saline.  The area was examined and then found to be hemostatic.  The lateral axillary tissue was then reanchored to the chest wall with interrupted 0 Vicryl stitches.  A small stab incision was made inferior to the operative bed along the anterior axillary line.  A tonsil clamp was placed through this opening and used to bring a 19 [place] round Blake drain into the operative bed.  The drain was anchored to the skin with a 2-0 nylon stitch.  The drain was curled along the chest wall.  Next the superior and inferior skin flaps were grossly reapproximated with interrupted 3-0 Vicryl stitches.  The skin was then closed with a running 4-0 Monocryl subcuticular stitch.  Dermabond dressings were applied.  The patient tolerated the procedure well.  At the end of the case all needle sponge and instrument counts were correct.  The patient was then awakened and taken to recovery in stable condition.  The drain was placed to bulb suction and there was a good seal.  PLAN OF CARE: Admit for overnight observation  PATIENT DISPOSITION:  PACU - hemodynamically stable.   Delay start of Pharmacological VTE agent (>24hrs) due to surgical blood loss or risk of bleeding: not applicable

## 2019-08-10 NOTE — Anesthesia Postprocedure Evaluation (Signed)
Anesthesia Post Note  Patient: Patricia Matthews  Procedure(s) Performed: LEFT MASTECTOMY (Left Breast)     Patient location during evaluation: PACU Anesthesia Type: General Level of consciousness: awake Pain management: pain level controlled Vital Signs Assessment: post-procedure vital signs reviewed and stable Respiratory status: spontaneous breathing Cardiovascular status: stable Postop Assessment: no apparent nausea or vomiting Anesthetic complications: no    Last Vitals:  Vitals:   08/10/19 1130 08/10/19 1200  BP: (!) 141/77 (!) 154/89  Pulse: 71 75  Resp: 11 16  Temp:  36.7 C  SpO2: 97% 100%    Last Pain:  Vitals:   08/10/19 1200  PainSc: 5                  Leveta Wahab

## 2019-08-10 NOTE — H&P (Signed)
Patricia Matthews  Location: [facility] Patient #: [id number] DOB: 01-Sep-1940 Undefined / Language: [place] / Race: Black or African American Female   History of Present Illness  The patient is a 79 year old female who presents with breast cancer. We are asked to see the patient in consultation by Dr. Mosetta Putt to evaluate her for a new left breast cancer. The patient is a 79 year old black female who presents with 5cm calcs in the UOQ of the left breast. 2 areas were biopsied and came back as DCIS and ADH that was ER and PR +. She does have a h/o right breast cancer treated with mastectomy, chemo, and radiation with full node dissection at [facility] in the past. She does not smoke   Past Surgical History  Breast Biopsy  Bilateral. Cataract Surgery  Bilateral. Knee Surgery  Right. Mastectomy  Right. Oral Surgery  Sentinel Lymph Node Biopsy   Diagnostic Studies History  Colonoscopy  1-5 years ago Mammogram  within last year Pap Smear  1-5 years ago  Social History  Caffeine use  Carbonated beverages, Coffee, Tea. No alcohol use  No drug use  Tobacco use  Never smoker.  Family History  Arthritis  Family Members In General. Breast Cancer  Family Members In General, Sister. Cerebrovascular Accident  Mother. Cervical Cancer  Family Members In General. Heart Disease  Mother. Heart disease in female family member before age 37  Hypertension  Mother. Malignant Neoplasm Of Pancreas  Family Members In General. Ovarian Cancer  Family Members In General. Prostate Cancer  Brother.  Pregnancy / Birth History  Age at menarche  13 years. Age of menopause  60-50 Contraceptive History  Intrauterine device. Gravida  3 Maternal age  37-20 Para  3 Regular periods   Other Problems  Arthritis  Breast Cancer  Depression  Gastroesophageal Reflux Disease  High blood pressure  Migraine Headache  Transfusion history     Review of Systems General  Not Present- Appetite Loss, Chills, Fatigue, Fever, Night Sweats, Weight Gain and Weight Loss. Skin Not Present- Change in Wart/Mole, Dryness, Hives, Jaundice, New Lesions, Non-Healing Wounds, Rash and Ulcer. HEENT Present- Ringing in the Ears and Wears glasses/contact lenses. Not Present- Earache, Hearing Loss, Hoarseness, Nose Bleed, Oral Ulcers, Seasonal Allergies, Sinus Pain, Sore Throat, Visual Disturbances and Yellow Eyes. Breast Not Present- Breast Mass, Breast Pain, Nipple Discharge and Skin Changes. Cardiovascular Not Present- Chest Pain, Difficulty Breathing Lying Down, Leg Cramps, Palpitations, Rapid Heart Rate, Shortness of Breath and Swelling of Extremities. Gastrointestinal Present- Nausea. Not Present- Abdominal Pain, Bloating, Bloody Stool, Change in Bowel Habits, Chronic diarrhea, Constipation, Difficulty Swallowing, Excessive gas, Gets full quickly at meals, Hemorrhoids, Indigestion, Rectal Pain and Vomiting. Female Genitourinary Not Present- Frequency, Nocturia, Painful Urination, Pelvic Pain and Urgency. Musculoskeletal Present- Back Pain and Joint Stiffness. Not Present- Joint Pain, Muscle Pain, Muscle Weakness and Swelling of Extremities. Neurological Present- Tingling. Not Present- Decreased Memory, Fainting, Headaches, Numbness, Seizures, Tremor, Trouble walking and Weakness. Psychiatric Present- Depression. Not Present- Anxiety, Bipolar, Change in Sleep Pattern, Fearful and Frequent crying. Endocrine Present- Cold Intolerance. Not Present- Excessive Hunger, Hair Changes, Heat Intolerance, Hot flashes and New Diabetes.   Physical Exam General Mental Status-Alert. General Appearance-Consistent with stated age. Hydration-Well hydrated. Voice-Normal.  Head and Neck Head-normocephalic, atraumatic with no lesions or palpable masses. Trachea-midline. Thyroid Gland Characteristics - normal size and consistency.  Eye Eyeball - Bilateral-Extraocular movements  intact. Sclera/Conjunctiva - Bilateral-No scleral icterus.  Chest and Lung Exam Chest and  lung exam reveals -quiet, even and easy respiratory effort with no use of accessory muscles and on auscultation, normal breath sounds, no adventitious sounds and normal vocal resonance. Inspection Chest Wall - Normal. Back - normal.  Breast Note: There is no palpable mass in the left breast. there is no palpable mass of the right chest wall. There is no palpable axillary, supraclavicular, or cervical lymphadenopathy   Cardiovascular Cardiovascular examination reveals -normal heart sounds, regular rate and rhythm with no murmurs and normal pedal pulses bilaterally.  Abdomen Inspection Inspection of the abdomen reveals - No Hernias. Skin - Scar - no surgical scars. Palpation/Percussion Palpation and Percussion of the abdomen reveal - Soft, Non Tender, No Rebound tenderness, No Rigidity (guarding) and No hepatosplenomegaly. Auscultation Auscultation of the abdomen reveals - Bowel sounds normal.  Neurologic Neurologic evaluation reveals -alert and oriented x 3 with no impairment of recent or remote memory. Mental Status-Normal.  Musculoskeletal Normal Exam - Left-Upper Extremity Strength Normal and Lower Extremity Strength Normal. Normal Exam - Right-Upper Extremity Strength Normal and Lower Extremity Strength Normal.  Lymphatic Head & Neck  General Head & Neck Lymphatics: Bilateral - Description - Normal. Axillary  General Axillary Region: Bilateral - Description - Normal. Tenderness - Non Tender. Femoral & Inguinal  Generalized Femoral & Inguinal Lymphatics: Bilateral - Description - Normal. Tenderness - Non Tender.   Addendum Note(Michaelpaul Apo S. Carolynne Edouard MD; 07/20/2019 2:51 PM) She does have a systolic murmur on auscultation    Assessment & Plan  DUCTAL CARCINOMA IN SITU (DCIS) OF LEFT BREAST (D05.12) Impression: The patient appears to have a 5cm area of dcis in the UOQ of  the left breast. with her h/o of right modified radical mastectomy she would prefer mastectomy on the left. Since this is all dcis and given her age she may not need a node dissection and even though she is having mastectomy this may preserve the left arm for blood draws and blood pressures. She agrees with this. I have discussed with her in detail the risks and benefits of the surgery as well as some of the technical aspects and she understands and wishes to proceed. She will meet with med and rad onc to discuss adjuvant therapy Current Plans Referred to Oncology, for evaluation and follow up (Oncology). Routine.

## 2019-08-10 NOTE — Anesthesia Procedure Notes (Signed)
Procedure Name: LMA Insertion Date/Time: 08/10/2019 8:33 AM Performed by: Marny Lowenstein, CRNA Pre-anesthesia Checklist: Patient identified, Emergency Drugs available, Suction available and Patient being monitored Patient Re-evaluated:Patient Re-evaluated prior to induction Oxygen Delivery Method: Circle system utilized Preoxygenation: Pre-oxygenation with 100% oxygen Induction Type: IV induction Ventilation: Mask ventilation without difficulty LMA: LMA inserted LMA Size: 4.0 Number of attempts: 1 Placement Confirmation: positive ETCO2 and breath sounds checked- equal and bilateral Tube secured with: Tape Dental Injury: Teeth and Oropharynx as per pre-operative assessment

## 2019-08-10 NOTE — Interval H&P Note (Signed)
History and Physical Interval Note:  08/10/2019 8:11 AM  Patricia Matthews  has presented today for surgery, with the diagnosis of LEFT BREAST DUCTAL CARCINOMA IN SITU.  The various methods of treatment have been discussed with the patient and family. After consideration of risks, benefits and other options for treatment, the patient has consented to  Procedure(s): LEFT MASTECTOMY (Left) as a surgical intervention.  The patient's history has been reviewed, patient examined, no change in status, stable for surgery.  I have reviewed the patient's chart and labs.  Questions were answered to the patient's satisfaction.     Chevis Pretty III

## 2019-08-11 ENCOUNTER — Encounter: Payer: Self-pay | Admitting: *Deleted

## 2019-08-15 ENCOUNTER — Encounter: Payer: Self-pay | Admitting: *Deleted

## 2019-08-15 LAB — SURGICAL PATHOLOGY

## 2019-08-17 ENCOUNTER — Encounter: Payer: Self-pay | Admitting: *Deleted

## 2019-08-22 ENCOUNTER — Inpatient Hospital Stay: Payer: Medicare Other | Attending: Hematology | Admitting: Hematology

## 2019-08-22 ENCOUNTER — Encounter: Payer: Self-pay | Admitting: *Deleted

## 2019-08-22 ENCOUNTER — Other Ambulatory Visit: Payer: Self-pay

## 2019-08-22 VITALS — BP 164/79 | HR 70 | Temp 98.3°F | Resp 17 | Ht 68.0 in | Wt 211.7 lb

## 2019-08-22 DIAGNOSIS — D0512 Intraductal carcinoma in situ of left breast: Secondary | ICD-10-CM

## 2019-08-22 NOTE — Progress Notes (Signed)
I have reviewed her recent surgical path, she does not need any adjuvant treatment for DCIS or breast cancer surviellance due to bilateral mastectomy. My nurse informed pt and her appointment was cancelled.   Malachy Mood  08/22/2019

## 2019-08-26 ENCOUNTER — Ambulatory Visit: Payer: Self-pay

## 2019-09-01 ENCOUNTER — Ambulatory Visit: Payer: Medicare Other | Attending: Internal Medicine

## 2019-09-01 DIAGNOSIS — Z23 Encounter for immunization: Secondary | ICD-10-CM | POA: Insufficient documentation

## 2019-09-01 NOTE — Progress Notes (Signed)
   Covid-19 Vaccination Clinic  Name:  Patricia Matthews    MRN: [id number] DOB: 1940-07-08  09/01/2019  [name] was observed post Covid-19 immunization for 15 minutes without incident. She was provided with Vaccine Information Sheet and instruction to access the V-Safe system.   [name] was instructed to call 911 with any severe reactions post vaccine: Marland Kitchen Difficulty breathing  . Swelling of face and throat  . A fast heartbeat  . A bad rash all over body  . Dizziness and weakness   Immunizations Administered    Name Date Dose VIS Date Route   Pfizer COVID-19 Vaccine 09/01/2019  8:34 AM 0.3 mL 06/10/2019 Intramuscular   Manufacturer: [employer], [employer]   Lot: [id number]   NDC: [address]-7

## 2019-09-27 ENCOUNTER — Ambulatory Visit: Payer: Medicare Other

## 2019-09-28 ENCOUNTER — Ambulatory Visit: Payer: Medicare Other | Attending: Internal Medicine

## 2019-09-28 DIAGNOSIS — Z23 Encounter for immunization: Secondary | ICD-10-CM

## 2019-09-28 NOTE — Progress Notes (Signed)
   Covid-19 Vaccination Clinic  Name:  Patricia Matthews    MRN: [id number] DOB: Feb 27, 1941  09/28/2019  [name] was observed post Covid-19 immunization for 15 minutes without incident. She was provided with Vaccine Information Sheet and instruction to access the V-Safe system.   [name] was instructed to call 911 with any severe reactions post vaccine: Marland Kitchen Difficulty breathing  . Swelling of face and throat  . A fast heartbeat  . A bad rash all over body  . Dizziness and weakness   Immunizations Administered    Name Date Dose VIS Date Route   Pfizer COVID-19 Vaccine 09/28/2019  9:00 AM 0.3 mL 06/10/2019 Intramuscular   Manufacturer: [employer], [employer]   Lot: [id number]   NDC: [address]-0

## 2019-11-21 ENCOUNTER — Encounter: Payer: Self-pay | Admitting: Licensed Clinical Social Worker

## 2019-11-21 NOTE — Progress Notes (Signed)
[facility] Clinical Social Work  Received VM from patient inquiring about knitted knockers program. CSW returned call and spoke with patient who is requesting two in size B. Knitted knockers will be mailed to patient per her request.  [name] Patricia Nisley, LCSW

## 2020-04-14 ENCOUNTER — Ambulatory Visit: Payer: Medicare Other | Attending: Internal Medicine

## 2020-04-14 DIAGNOSIS — Z23 Encounter for immunization: Secondary | ICD-10-CM

## 2020-04-14 NOTE — Progress Notes (Signed)
   Covid-19 Vaccination Clinic  Name:  Patricia Matthews    MRN: [id number] DOB: 18-Feb-1941  04/14/2020  Patricia Matthews was observed post Covid-19 immunization for 15 minutes without incident. She was provided with Vaccine Information Sheet and instruction to access the V-Safe system.   Patricia Matthews was instructed to call 911 with any severe reactions post vaccine: Marland Kitchen Difficulty breathing  . Swelling of face and throat  . A fast heartbeat  . A bad rash all over body  . Dizziness and weakness

## 2020-04-24 IMAGING — MG MM BREAST BX W LOC DEV EA AD LESION IMG BX SPEC STEREO GUIDE*L*
5 series · 5 of 17 positions shown · non-contrast
Comparison: Previous exams.
COMPARISON: Previous exams.

Addendum:
CLINICAL DATA: Patient presents for stereotactic guided core biopsy
of 2 sites in the LEFT breast.

EXAM:
LEFT BREAST STEREOTACTIC CORE NEEDLE BIOPSY x2

[L CC (1 of 2)]
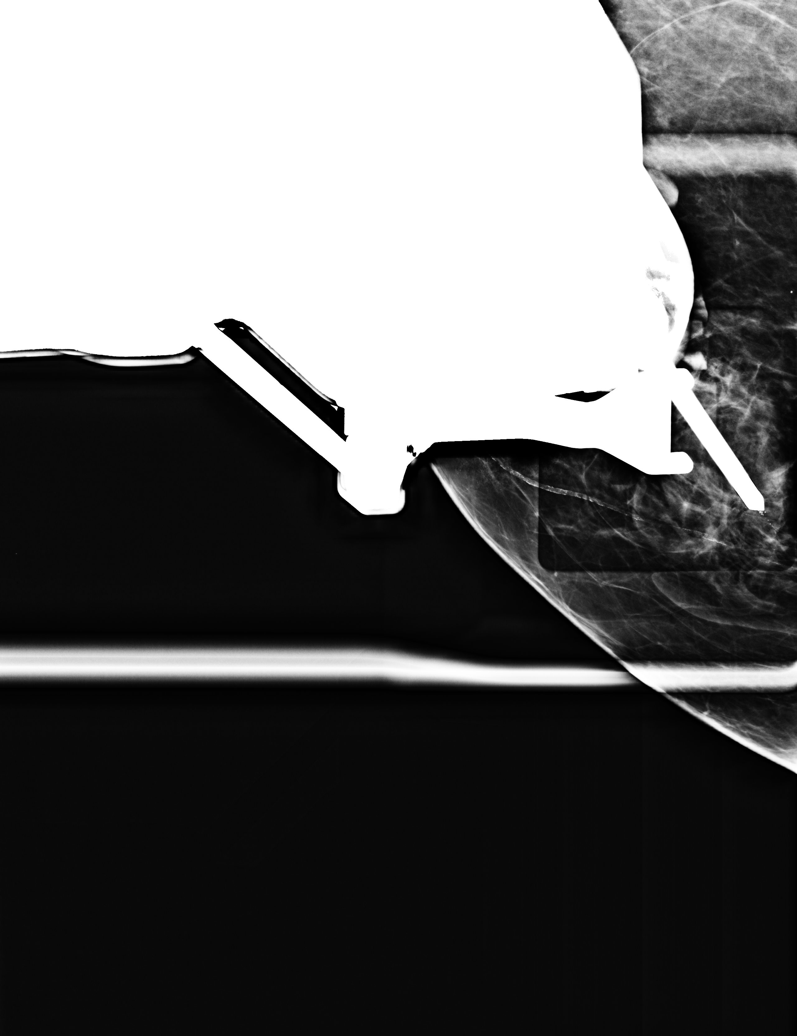

[L CC (2 of 2)]
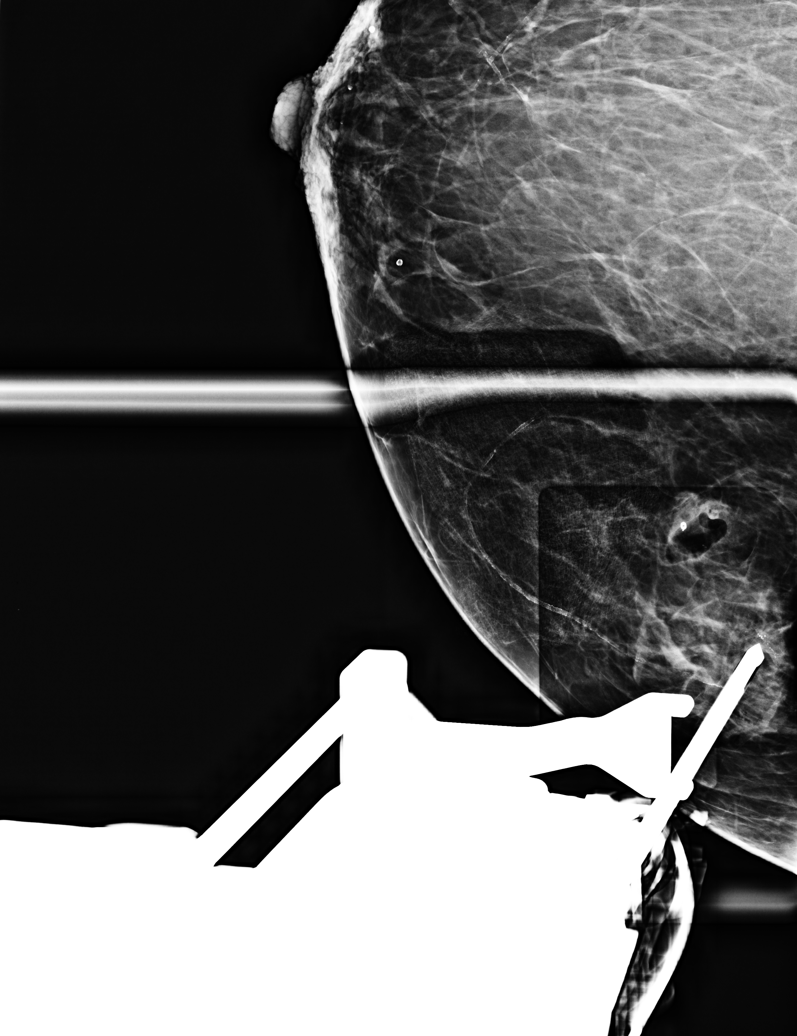

[L CC tomo (1 of 3) · tomo slice 24/47.0]
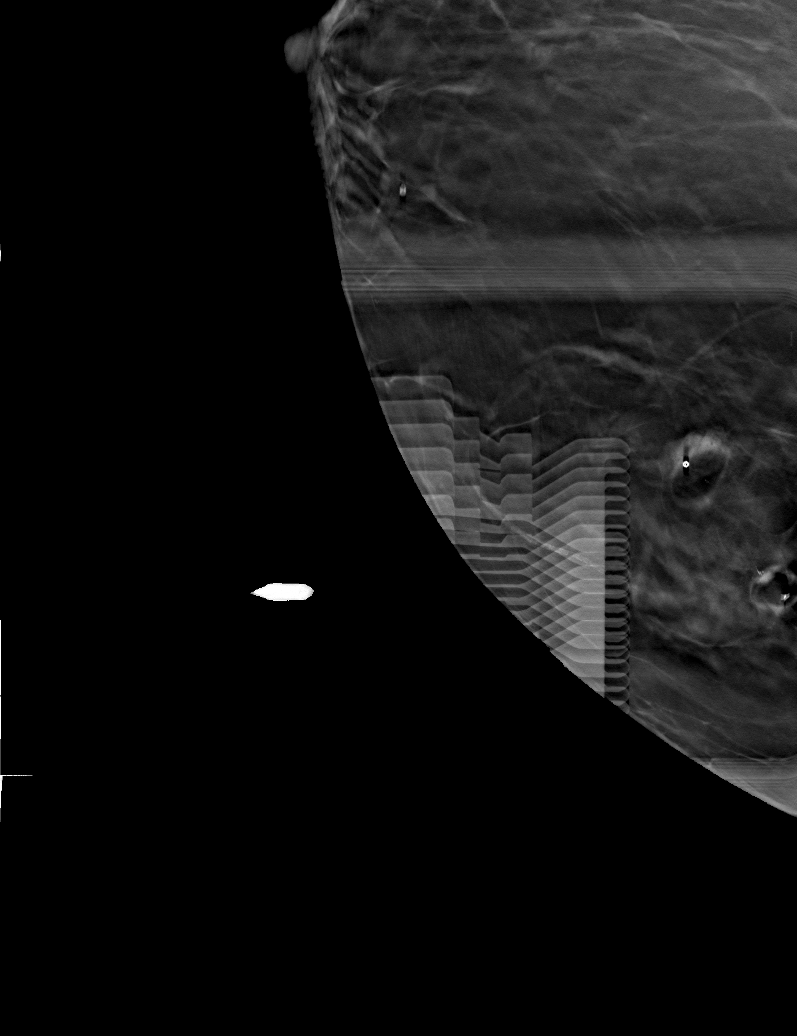

[L CC tomo (2 of 3) · tomo slice 24/47.0]
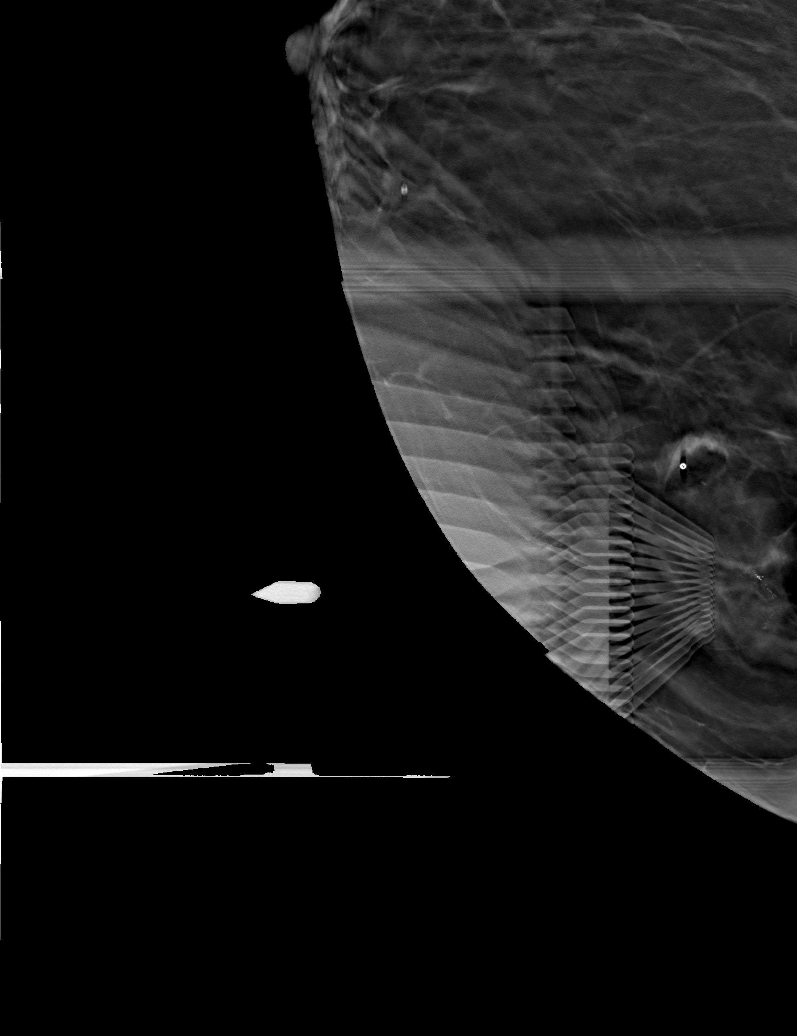

[L CC tomo (3 of 3) · tomo slice 24/47.0]
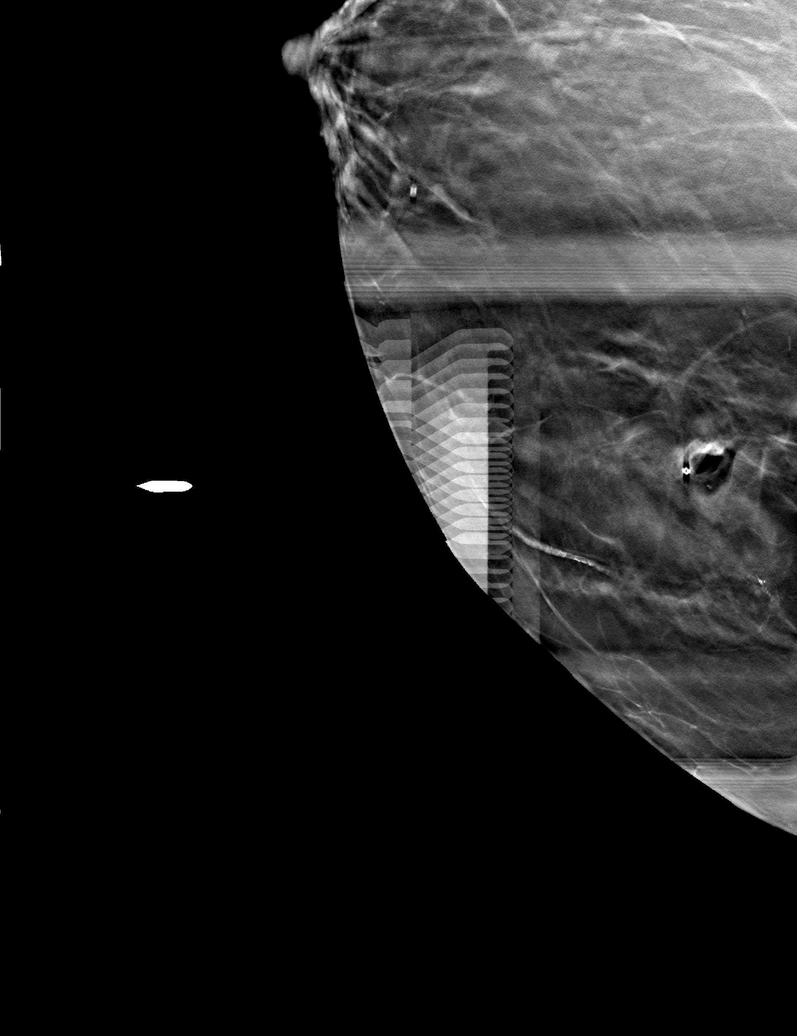

[5 of 17 positions shown; findings below may reference images not displayed]



Site 1: Anterior UPPER OUTER QUADRANT LEFT breast. Lesion quadrant:
UPPER-OUTER QUADRANT LEFT breast

Using sterile technique and 1% lidocaine and 2% lidocaine with
epinephrine as local anesthetic, under stereotactic guidance, a 9
gauge vacuum assisted device was used to perform core needle biopsy
of calcifications in the anterior UPPER OUTER QUADRANT of the breast
using a 2 craniocaudal approach. Specimen radiograph was performed
showing calcifications in numerous samples. Specimens with
calcifications are identified for pathology. At the conclusion of
the procedure, coil shaped tissue marker clip was deployed into the
biopsy cavity.

Site 2: Posterior UPPER OUTER QUADRANT LEFT breast. Lesion quadrant:
UPPER-OUTER QUADRANT breast

Using sterile technique and 1% lidocaine and 2% lidocaine with
epinephrine as local anesthetic, under stereotactic guidance, a 9
gauge vacuum assisted device was used to perform core needle biopsy
of calcifications in the posterior UPPER OUTER QUADRANT LEFT breast
using a craniocaudal approach. Specimen radiograph was performed
showing calcifications in numerous samples. Specimens with
calcifications are identified for pathology. At the conclusion of
the procedure, X shaped tissue marker clip was deployed into the
biopsy cavity.

Follow-up 2-view mammogram was performed and dictated separately.
IMPRESSION: Stereotactic-guided biopsy of 2 sites of calcifications in the LEFT
breast.

No apparent complications.

ADDENDUM:
Pathology revealed LOW GRADE DUCTAL CARCINOMA IN SITU WITH
CALCIFICATIONS, USUAL DUCTAL HYPERPLASIA of the LEFT breast,
anterior, upper outer quadrant, coil clip. This was found to be
concordant by Dr. Datiny Ulisse.

Pathology revealed ATYPICAL DUCTAL HYPERPLASIA WITH CALCIFICATIONS
of the LEFT breast, posterior, upper outer quadrant, X clip. This
was found to be concordant by Dr. Datiny Ulisse, with excision
recommended.

Pathology results were discussed with the patient by telephone. The
patient reported doing well after the biopsies with tenderness at
the sites. Post biopsy instructions and care were reviewed and
questions were answered. The patient was encouraged to call The

The patient was referred to [REDACTED]
[REDACTED] at [REDACTED] on
July 20, 2019.

Pathology results reported by Shaunna Morello RN on 07/14/2019.



Site 1: Anterior UPPER OUTER QUADRANT LEFT breast. Lesion quadrant:
UPPER-OUTER QUADRANT LEFT breast

Using sterile technique and 1% lidocaine and 2% lidocaine with
epinephrine as local anesthetic, under stereotactic guidance, a 9
gauge vacuum assisted device was used to perform core needle biopsy
of calcifications in the anterior UPPER OUTER QUADRANT of the breast
using a 2 craniocaudal approach. Specimen radiograph was performed
showing calcifications in numerous samples. Specimens with
calcifications are identified for pathology. At the conclusion of
the procedure, coil shaped tissue marker clip was deployed into the
biopsy cavity.

Site 2: Posterior UPPER OUTER QUADRANT LEFT breast. Lesion quadrant:
UPPER-OUTER QUADRANT breast

Using sterile technique and 1% lidocaine and 2% lidocaine with
epinephrine as local anesthetic, under stereotactic guidance, a 9
gauge vacuum assisted device was used to perform core needle biopsy
of calcifications in the posterior UPPER OUTER QUADRANT LEFT breast
using a craniocaudal approach. Specimen radiograph was performed
showing calcifications in numerous samples. Specimens with
calcifications are identified for pathology. At the conclusion of
the procedure, X shaped tissue marker clip was deployed into the
biopsy cavity.

Follow-up 2-view mammogram was performed and dictated separately.
IMPRESSION: Stereotactic-guided biopsy of 2 sites of calcifications in the LEFT
breast.

No apparent complications.
